# Patient Record
Sex: Female | Born: 1950
Health system: Southern US, Community
[De-identification: ages and names within clinical notes are randomized; demographics above are authoritative.]

## PROBLEM LIST (undated history)

## (undated) DIAGNOSIS — K219 Gastro-esophageal reflux disease without esophagitis: Secondary | ICD-10-CM

## (undated) DIAGNOSIS — M542 Cervicalgia: Secondary | ICD-10-CM

## (undated) DIAGNOSIS — R011 Cardiac murmur, unspecified: Secondary | ICD-10-CM

## (undated) DIAGNOSIS — N898 Other specified noninflammatory disorders of vagina: Secondary | ICD-10-CM

## (undated) DIAGNOSIS — I350 Nonrheumatic aortic (valve) stenosis: Secondary | ICD-10-CM

## (undated) DIAGNOSIS — I10 Essential (primary) hypertension: Secondary | ICD-10-CM

## (undated) DIAGNOSIS — E119 Type 2 diabetes mellitus without complications: Secondary | ICD-10-CM

## (undated) HISTORY — PX: CHOLECYSTECTOMY: SHX55

## (undated) HISTORY — PX: ABDOMINAL HYSTERECTOMY: SHX81

## (undated) HISTORY — DX: Other specified noninflammatory disorders of vagina: N89.8

## (undated) HISTORY — DX: Nonrheumatic aortic (valve) stenosis: I35.0

## (undated) HISTORY — DX: Gastro-esophageal reflux disease without esophagitis: K21.9

## (undated) HISTORY — DX: Essential (primary) hypertension: I10

---

## 2000-10-13 ENCOUNTER — Ambulatory Visit (HOSPITAL_COMMUNITY): Admission: RE | Admit: 2000-10-13 | Discharge: 2000-10-13 | Payer: Self-pay | Admitting: Internal Medicine

## 2000-10-13 ENCOUNTER — Encounter: Payer: Self-pay | Admitting: Internal Medicine

## 2000-12-06 ENCOUNTER — Other Ambulatory Visit: Admission: RE | Admit: 2000-12-06 | Discharge: 2000-12-06 | Payer: Self-pay | Admitting: Obstetrics and Gynecology

## 2004-02-07 ENCOUNTER — Emergency Department (HOSPITAL_COMMUNITY): Admission: EM | Admit: 2004-02-07 | Discharge: 2004-02-07 | Payer: Self-pay | Admitting: Emergency Medicine

## 2009-09-09 ENCOUNTER — Ambulatory Visit (HOSPITAL_COMMUNITY): Admission: RE | Admit: 2009-09-09 | Discharge: 2009-09-09 | Payer: Self-pay | Admitting: Family Medicine

## 2012-12-14 ENCOUNTER — Encounter: Payer: Self-pay | Admitting: Adult Health

## 2012-12-14 ENCOUNTER — Ambulatory Visit (INDEPENDENT_AMBULATORY_CARE_PROVIDER_SITE_OTHER): Payer: BC Managed Care – PPO | Admitting: Adult Health

## 2012-12-14 VITALS — BP 100/60 | Ht 61.0 in | Wt 159.0 lb

## 2012-12-14 DIAGNOSIS — N9489 Other specified conditions associated with female genital organs and menstrual cycle: Secondary | ICD-10-CM

## 2012-12-14 DIAGNOSIS — N898 Other specified noninflammatory disorders of vagina: Secondary | ICD-10-CM | POA: Insufficient documentation

## 2012-12-14 DIAGNOSIS — IMO0002 Reserved for concepts with insufficient information to code with codable children: Secondary | ICD-10-CM

## 2012-12-14 HISTORY — DX: Other specified noninflammatory disorders of vagina: N89.8

## 2012-12-14 MED ORDER — ESTROGENS, CONJUGATED 0.625 MG/GM VA CREA: TOPICAL_CREAM | VAGINAL | Status: DC

## 2012-12-14 NOTE — Patient Instructions (Addendum)
Try premarin vag cream 0.5 gm 2 x week and luvena 3 x week Use astroglide with sex  Increase for play and put hips on pillows Use creams at least 2 weeks before trying to have sex Follow up in 6 weeks

## 2012-12-14 NOTE — Progress Notes (Signed)
Subjective:     Patient ID: Lindsey Evans, female   DOB: 10/22/1950, 62 y.o.   MRN: [id number]  HPI Lindsey Evans is a 62 year old black female, married in complaining of vaginal dryness and pain with sex, and decrease in libido.   Review of Systems Positives in HPI Reviewed past medical,surgical, social and family history. Reviewed medications and allergies.     Objective:   Physical Exam BP 100/60  Ht 5\' 1"  (1.549 m)  Wt 159 lb (72.122 kg)  BMI 30.06 kg/m2   Skin warm and dry.Pelvic: external genitalia is normal in appearance for age, and she has vitiligo, vagina: has decrease cololr, moisture and rugae, cervix and uterus are absent, adnexa: no masses or tenderness noted.  Assessment:    Vaginal dryness and atrophy   Dyspareunia   Plan:      Try Luvena 3 x week Try Premarin vaginal cream 0.5 gm 2 x week, Medication Samples have been provided to the patient. Drug name: Premarin vaginal cream  Qty:  4 tubes  LOT: [id number]  Exp.Date: 2/15 The patient has been instructed regarding the correct time, dose, and frequency of taking this medication, including desired effects and most common side effects.  Astroglide with sex, increase foreplay and elevate hips on pillows Return in 6 weeks for follow up  GRIFFIN,JENNIFER 2:24 PM 12/14/2012

## 2013-01-25 ENCOUNTER — Ambulatory Visit (INDEPENDENT_AMBULATORY_CARE_PROVIDER_SITE_OTHER): Payer: BC Managed Care – PPO | Admitting: Adult Health

## 2013-01-25 ENCOUNTER — Encounter: Payer: Self-pay | Admitting: Adult Health

## 2013-01-25 VITALS — BP 128/80 | Ht 61.0 in | Wt 161.0 lb

## 2013-01-25 DIAGNOSIS — IMO0002 Reserved for concepts with insufficient information to code with codable children: Secondary | ICD-10-CM

## 2013-01-25 DIAGNOSIS — N898 Other specified noninflammatory disorders of vagina: Secondary | ICD-10-CM

## 2013-01-25 DIAGNOSIS — N9489 Other specified conditions associated with female genital organs and menstrual cycle: Secondary | ICD-10-CM

## 2013-01-25 MED ORDER — ESTROGENS, CONJUGATED 0.625 MG/GM VA CREA: TOPICAL_CREAM | VAGINAL | Status: DC

## 2013-01-25 NOTE — Patient Instructions (Addendum)
continue premarin vag cream and luvena could do PVC 3 x weekly if desired Follow up prn Review handout osphenia

## 2013-01-25 NOTE — Progress Notes (Signed)
Subjective:     Patient ID: Lindsey Evans, female   DOB: Dec 04, 1950, 62 y.o.   MRN: [id number]  HPI Lindsey Evans is back after using premarin vaginal cream and luvena and she is not as dry, sex still hurts some, and she is having trouble reaching orgasm.  Review of Systems See HPI Reviewed past medical,surgical, social and family history. Reviewed medications and allergies.     Objective:   Physical Exam BP 128/80  Ht 5\' 1"  (1.549 m)  Wt 161 lb (73.029 kg)  BMI 30.44 kg/m2 Skin warm and dry.Pelvic: external genitalia is normal in appearance, vagina:has more moisture and elasticity and better color,the cervix and uterus are absent. adnexa: no masses or tenderness noted.   Discussed clitoral stimulation and using lubricate. Can try PVC 3 x weekly to see if helps.  Assessment:     Dyspareunia Vaginal dryness resolving    Plan:     Refilled premarin vaginal cream Continue luvena Review handout on osphenia Follow up prn

## 2014-01-03 ENCOUNTER — Ambulatory Visit (INDEPENDENT_AMBULATORY_CARE_PROVIDER_SITE_OTHER): Payer: BC Managed Care – PPO | Admitting: Advanced Practice Midwife

## 2014-01-03 ENCOUNTER — Encounter: Payer: Self-pay | Admitting: Advanced Practice Midwife

## 2014-01-03 VITALS — BP 120/84 | Ht 63.0 in | Wt 166.0 lb

## 2014-01-03 DIAGNOSIS — Z01419 Encounter for gynecological examination (general) (routine) without abnormal findings: Secondary | ICD-10-CM

## 2014-01-03 DIAGNOSIS — I10 Essential (primary) hypertension: Secondary | ICD-10-CM | POA: Insufficient documentation

## 2014-01-03 DIAGNOSIS — IMO0002 Reserved for concepts with insufficient information to code with codable children: Secondary | ICD-10-CM | POA: Insufficient documentation

## 2014-01-03 DIAGNOSIS — E785 Hyperlipidemia, unspecified: Secondary | ICD-10-CM | POA: Insufficient documentation

## 2014-01-03 MED ORDER — OSPEMIFENE 60 MG PO TABS: 60.0000 mg | ORAL_TABLET | Freq: Every day | ORAL | Status: DC

## 2014-01-03 NOTE — Progress Notes (Signed)
Lindsey Evans 63 y.o.  Filed Vitals:   01/03/14 1018  BP: 120/84     Past Medical History: Past Medical History  Diagnosis Date  . Hypertension   . GERD (gastroesophageal reflux disease)   . Vaginal dryness 12/14/2012    Past Surgical History: Past Surgical History  Procedure Laterality Date  . Abdominal hysterectomy    . Cholecystectomy      Family History: Family History  Problem Relation Age of Onset  . Hypertension Mother   . Cancer Father     prostate  . Cancer Paternal Aunt     breast  . Diabetes Paternal Uncle     Social History: History  Substance Use Topics  . Smoking status: Never Smoker   . Smokeless tobacco: Never Used  . Alcohol Use: No    Allergies: No Known Allergies   Current outpatient prescriptions:ALPRAZolam (XANAX) 0.5 MG tablet, Take 0.5 mg by mouth as needed. , Disp: , Rfl: ;  amLODipine (NORVASC) 5 MG tablet, Take 5 mg by mouth daily., Disp: , Rfl: ;  clotrimazole-betamethasone (LOTRISONE) cream, Apply 1 application topically 2 (two) times daily. , Disp: , Rfl: ;  esomeprazole (NEXIUM) 40 MG capsule, Take 40 mg by mouth daily before breakfast., Disp: , Rfl:  hydrochlorothiazide (HYDRODIURIL) 25 MG tablet, Take 25 mg by mouth daily., Disp: , Rfl: ;  metoprolol (LOPRESSOR) 50 MG tablet, Take 50 mg by mouth 2 (two) times daily., Disp: , Rfl: ;  conjugated estrogens (PREMARIN) vaginal cream, Use 0.5 gm 2 x week, Disp: 30 g, Rfl: 6;  Ospemifene 60 MG TABS, Take 60 mg by mouth daily., Disp: 30 tablet, Rfl: 11  History of Present Illness:  Here for well woman exam.  Sees PCP MD regularly who watches labs.  She has a several year hx of vaginal dryness and dyspareunia.  She was on Premarin vaginal cream/Luvena last year.  It definitely helped, but she quit using it as directed d/t "potential SE".  Desires an oral regimen. Discussed osphena and she wants to try this.  She states her last mammogram was "a few years ago" at a mobile site brought to her  work.  She states that she will probably never get yearly mammograms or a colonoscopy because "I think that messing with something that's not broke can cause problems.".  Discussed definite advantages of these screening tests and strongly advised pt to get them. She "probably will" get a mammogram but "definitely won't" get a colonoscopy.    Review of Systems   Patient denies any headaches, blurred vision, shortness of breath, chest pain, abdominal pain, problems with bowel movements, urination  Physical Exam: General:  Well developed, well nourished, no acute distress Skin:  Warm and dry Neck:  Midline trachea, normal thyroid Lungs; Clear to auscultation bilaterally Breast:  No dominant palpable mass, retraction, or nipple discharge Cardiovascular: Regular rate and rhythm Abdomen:  Soft, non tender, no hepatosplenomegaly Pelvic:  External genitalia is normal in appearance. Vagina is pale pink with fair rugae.  No lesions. Uterus and ovaries surgically absent Rectal: Good sphincter tone, no polyps, or hemorrhoids felt. Guiac negative  Extremities:  No swelling or varicosities noted Psych:  No mood changes   Impression:  Well woman exam Dyspareunia 2/2 vaginal dryness   Plan: Osphena 60mg  qd and Luvena q 3-4 days

## 2014-01-09 ENCOUNTER — Other Ambulatory Visit: Payer: BC Managed Care – PPO | Admitting: Advanced Practice Midwife

## 2014-01-15 ENCOUNTER — Ambulatory Visit (HOSPITAL_COMMUNITY)
Admission: RE | Admit: 2014-01-15 | Discharge: 2014-01-15 | Disposition: A | Discharge status: Home / Self Care | Payer: BC Managed Care – PPO | Source: Ambulatory Visit | Attending: Family Medicine | Admitting: Family Medicine

## 2014-01-15 ENCOUNTER — Other Ambulatory Visit (HOSPITAL_COMMUNITY): Payer: Self-pay | Admitting: Family Medicine

## 2014-01-15 ENCOUNTER — Encounter (HOSPITAL_COMMUNITY): Payer: Self-pay

## 2014-01-15 DIAGNOSIS — M542 Cervicalgia: Secondary | ICD-10-CM | POA: Insufficient documentation

## 2014-01-15 DIAGNOSIS — R51 Headache: Secondary | ICD-10-CM | POA: Insufficient documentation

## 2014-01-16 ENCOUNTER — Other Ambulatory Visit (HOSPITAL_COMMUNITY): Payer: Self-pay | Admitting: Family Medicine

## 2014-01-16 DIAGNOSIS — R51 Headache: Secondary | ICD-10-CM

## 2014-01-17 ENCOUNTER — Emergency Department (HOSPITAL_COMMUNITY)
Admission: EM | Admit: 2014-01-17 | Discharge: 2014-01-17 | Disposition: A | Discharge status: Home / Self Care | Payer: BC Managed Care – PPO | Attending: Emergency Medicine | Admitting: Emergency Medicine

## 2014-01-17 ENCOUNTER — Emergency Department (HOSPITAL_COMMUNITY): Payer: BC Managed Care – PPO

## 2014-01-17 ENCOUNTER — Encounter (HOSPITAL_COMMUNITY): Payer: Self-pay | Admitting: Emergency Medicine

## 2014-01-17 DIAGNOSIS — R519 Headache, unspecified: Secondary | ICD-10-CM

## 2014-01-17 DIAGNOSIS — Z8742 Personal history of other diseases of the female genital tract: Secondary | ICD-10-CM | POA: Insufficient documentation

## 2014-01-17 DIAGNOSIS — Z79899 Other long term (current) drug therapy: Secondary | ICD-10-CM | POA: Insufficient documentation

## 2014-01-17 DIAGNOSIS — K219 Gastro-esophageal reflux disease without esophagitis: Secondary | ICD-10-CM | POA: Insufficient documentation

## 2014-01-17 DIAGNOSIS — I10 Essential (primary) hypertension: Secondary | ICD-10-CM | POA: Insufficient documentation

## 2014-01-17 DIAGNOSIS — M62838 Other muscle spasm: Secondary | ICD-10-CM | POA: Insufficient documentation

## 2014-01-17 DIAGNOSIS — R51 Headache: Secondary | ICD-10-CM | POA: Insufficient documentation

## 2014-01-17 MED ORDER — DIPHENHYDRAMINE HCL 25 MG PO CAPS
25.0000 mg | ORAL_CAPSULE | Freq: Once | ORAL | Status: AC
  Administered 2014-01-17: 25 mg via ORAL
  Filled 2014-01-17: qty 1

## 2014-01-17 MED ORDER — PROMETHAZINE HCL 25 MG/ML IJ SOLN: 12.5000 mg | Freq: Once | INTRAMUSCULAR | Status: DC

## 2014-01-17 MED ORDER — KETOROLAC TROMETHAMINE 30 MG/ML IJ SOLN
30.0000 mg | Freq: Once | INTRAMUSCULAR | Status: AC
  Administered 2014-01-17: 30 mg via INTRAMUSCULAR
  Filled 2014-01-17: qty 1

## 2014-01-17 MED ORDER — IBUPROFEN 800 MG PO TABS: 800.0000 mg | ORAL_TABLET | Freq: Three times a day (TID) | ORAL | Status: DC

## 2014-01-17 MED ORDER — ACETAMINOPHEN 325 MG PO TABS
650.0000 mg | ORAL_TABLET | Freq: Once | ORAL | Status: AC
  Administered 2014-01-17: 650 mg via ORAL
  Filled 2014-01-17: qty 2

## 2014-01-17 MED ORDER — METHOCARBAMOL 500 MG PO TABS: 500.0000 mg | ORAL_TABLET | Freq: Two times a day (BID) | ORAL | Status: DC | PRN

## 2014-01-17 NOTE — Discharge Instructions (Signed)
°Emergency Department Resource Guide °1) Find a Doctor and Pay Out of Pocket °Although you won't have to find out who is covered by your insurance plan, it is a good idea to ask around and get recommendations. You will then need to call the office and see if the doctor you have chosen will accept you as a new patient and what types of options they offer for patients who are self-pay. Some doctors offer discounts or will set up payment plans for their patients who do not have insurance, but you will need to ask so you aren't surprised when you get to your appointment. ° °2) Contact Your Local Health Department °Not all health departments have doctors that can see patients for sick visits, but many do, so it is worth a call to see if yours does. If you don't know where your local health department is, you can check in your phone book. The CDC also has a tool to help you locate your state's health department, and many state websites also have listings of all of their local health departments. ° °3) Find a Walk-in Clinic °If your illness is not likely to be very severe or complicated, you may want to try a walk in clinic. These are popping up all over the country in pharmacies, drugstores, and shopping centers. They're usually staffed by nurse practitioners or physician assistants that have been trained to treat common illnesses and complaints. They're usually fairly quick and inexpensive. However, if you have serious medical issues or chronic medical problems, these are probably not your best option. ° °No Primary Care Doctor: °- Call Health Connect at  832-8000 - they can help you locate a primary care doctor that  accepts your insurance, provides certain services, etc. °- Physician Referral Service- 1-800-533-3463 ° °Chronic Pain Problems: °Organization         Address  Phone   Notes  °[facility]  (336) 297-2271 Patients need to be referred by their primary care doctor.  ° °Medication  Assistance: °Organization         Address  Phone   Notes  °[place] [facility] [address] °[place], [place] [address] (336) 641-8030 --Must be a resident of [place] °-- Must have NO insurance coverage whatsoever (no Medicaid/ Medicare, etc.) °-- The pt. MUST have a primary care doctor that directs their care regularly and follows them in the community °  °[facility]  (866) 331-1348   °[employer]  (888) 892-1162   ° °Agencies that provide inexpensive medical care: °Organization         Address  Phone   Notes  °[facility]  (336) 832-8035   °[facility]    (336) 832-7272   °[facility] [address] °[place], [place] [address] (336) 832-4777   °[facility] [address], °[place] (336) 271-4999   °[facility]    (336) 373-0678   °[facility]    (336) 272-1050   °[facility] ° [address], [place] Phone:  (336) 832-4444, Fax:  (336) 832-4440 Hours of Operation:  9 am - 6 pm, M-F.  Also accepts Medicaid/Medicare and self-pay.  °[facility] ° [address],  Phone: (336) 832-3150, Fax: (336) [phone]. Hours of Operation:  8:30 am - 5:30 pm, M-F.  Also accepts Medicaid and self-pay.  °[facility] [place] [address], [place] Phone: (336) 878-6027   °[facility] [address], [place], [place] (336)723-1848, Ext. 123 Mondays & Thursdays: 7-9 AM.  First 15 patients are seen on a first come, first serve basis. °  ° °Medicaid-accepting [place] Providers: ° °Organization         Address  Phone   Notes  °[facility] [address], [place] (336) 641-2100 Also accepts self-pay patients.  °[facility] [address], [place] ° (336) 856-9996   °[facility] [address], [place]  (336) 288-8857   °[facility] [address] [place] [address], [place] (336) 299-7000   °Veita Bland [address], [place]  ° (336) 373-1557 Only accepts [place] Access Medicaid patients after they have their name applied to their card.  ° °Self-Pay (no insurance) in [place]: ° °Organization         Address  Phone   Notes  °Sickle Cell Patients, [facility] [address], [place] (336) 832-1970   °[facility] [address], [place] (336) 832-4400   °[facility] [place] ° [address], [place] (336) 992-4800   °[facility]/Dr. Osei-Bonsu ° [address] [place] [address], [place] or [address], [place] (336) 841-8500 Phone number for both [place] and [place] locations is the same.  °[facility] [address], [place] (336) 299-0000   °[facility] [address] [place] [address], [place] or [address] (336) 852-7530 °(336) 878-2260   °[facility] [address], [place] (336) 350-1642, phone; (336) 294-5005, fax Sees patients 1st and 3rd Saturday of every month.  Must not qualify for public or private insurance (i.e. Medicaid, Medicare, [place] Health Choice, Veterans' Benefits) • Household income should be no more than 200% of the poverty level •The clinic cannot treat you if you are pregnant or think you are pregnant • Sexually transmitted diseases are not treated at the clinic.  ° ° °Dental Care: °Organization         Address  Phone  Notes  °[place] [facility] [address], [place] (336) 641-6152 Accepts children up to age 21 who are enrolled in Medicaid or [place] Health Choice; pregnant women with a Medicaid card; and children who have applied for Medicaid or [place] Health Choice, but were declined, whose parents can pay a reduced fee at time of service.  °[place]  [facility] [place]  [address], [place] (336) 641-7733 Accepts children up to age 21 who are enrolled in Medicaid or [place] Health Choice; pregnant women with a Medicaid card; and children who have applied for Medicaid or [place] Health Choice, but were declined, whose parents can pay a reduced fee at time of service.  °[facility] ° [address], [place] (336) 641-4533 Patients are seen by appointment only. Walk-ins are not accepted. [facility] will see patients 18 years of age and older. °Monday - Tuesday (8am-5pm) °Most Wednesdays (8:30-5pm) °$30 per visit, cash only  °[facility] ° [address], [place] (336) 641-4533 Patients are seen by appointment only. Walk-ins are not accepted. [facility] will see patients 18 years of age and older. °One   Wednesday Evening (Monthly: Volunteer Based).  $30 per visit, cash only  °[facility]  (919) 537-3737 for adults; Children under age 4, call Graduate Pediatric Dentistry at ([phone]) [phone]. Children aged 4-14, please call ([phone]) [phone] to request a pediatric application. ° Dental services are provided in all areas of dental care including fillings, crowns and bridges, complete and partial dentures, implants, gum treatment, root canals, and extractions. Preventive care is also provided. Treatment is provided to both adults and children. °Patients are selected via a lottery and there is often a waiting list. °  °[facility] [address], °[place] ° (336) 763-8833 [email or web address] °  °[facility] [address], [place], [place] (336)723-1848, Ext. 123 Second and Fourth Thursday of each month, opens at 6:30 AM; Clinic ends at 9 AM.  Patients are seen on a first-come first-served basis, and a limited number are seen during each clinic.  ° °[facility] ° [address], [place], [place] (336) 723-7904    Eligibility Requirements °You must have lived in [place], [place], or [place] counties for at least the last three months. °  You cannot be eligible for state or federal sponsored healthcare insurance, including Veterans Administration, Medicaid, or Medicare. °  You generally cannot be eligible for healthcare insurance through your employer.  °  How to apply: °Eligibility screenings are held every Tuesday and Wednesday afternoon from 1:00 pm until 4:00 pm. You do not need an appointment for the interview!  °[facility] [address], [place], [place] 336-631-2330   °[facility]  336-342-8273   °[facility]  336-703-3100   °[facility]  336-570-6415   ° °Behavioral Health Resources in the Community: °Intensive Outpatient Programs °Organization         Address  Phone  Notes  °[facility] [address], [place], [place] 336-878-6098   °[facility] [address], [place], [place] 336-832-9800   °[facility] [address], [place], [place] ° 336-882-2125   °[facility] [address],  °[place], [place] 1-800-853-5163 or 336-641-4981   °Substance Abuse Resources °Organization         Address  Phone  Notes  °[facility]  336-882-2125   °[facility]  336-784-9470   °[facility]  336-285-9073   °[facility]  336-845-3988   °Residential & Outpatient Substance Abuse Program  1-800-659-3381   °Psychological Services °Organization         Address  Phone  Notes  °[facility]  336- 832-9600   °[facility]  336- 378-7881   °[facility] [address], [place] 1-800-853-5163 or 336-641-4981   ° °Mobile Crisis Teams °Organization         Address  Phone  Notes  °[facility], Mobile Crisis Care Unit  1-877-626-1772   °Assertive °[facility] ° [address]  [place], [place] 336-834-9664   °Sharon DeEsch [address] °[place] 336-554-5454   ° °Self-Help/Support Groups °Organization         Address  Phone             Notes  °[facility]  - variety of support groups  336- 373-1402 Call for more information  °Narcotics Anonymous (NA), [facility] [address], °[place]   2 meetings at this location  ° °  Residential Treatment Programs Organization         Address  Phone  Notes  [facility] [address]    [place]  1-817-762-8220   [facility]  [address], [place] [address], [place], [place]   [place], [place] 971-252-7245 Admissions: 8am-3pm M-F  [facility] [place] [address]    [place], [place] 670-141-0301   [facility] [address] [place], [place], [place]   [facility] [address]  [place], [place]   [facility] Intensive Outpatient [address], [place], [place]   [facility] [address]  [place], [place] 1-(601)261-5766 or 252-091-2225   [facility] [address], [place], [place] Accepts Medicaid  [facility] [address]  [place] 1-2094428644 Substance Abuse/Addiction Treatment   [facility]         Address  Phone  Notes  [facility]  716-855-5741   Domenic Schwab, PhD [address] [place], [place]   (718) 309-8361 or (575) 037-8122   [place], [place] 670-394-0560   [facility] [address], [place], [place] [phone] Insurance/Medicaid/sponsorship through [facility] and Families [address]                                    [place], [place] 863-821-8807 [facility] [address], [place] 901-078-5956    Dr. Adele Schilder  [phone]   [facility] Dept. 1) [address] [place] 2) [place] 3)  [address], [place] 904-471-2733 (458)[phone]  [phone]   [place] (340) 535-2341 or 201-076-5008 (After Hours)       Stop taking the naprosyn and flexeril. Take the new prescriptions as directed.  Apply moist heat or ice to the area(s) of discomfort, for 15 minutes at a time, several times per day for the next few days.  Do not fall asleep on a heating or ice pack.  Call your regular medical doctor today to schedule a follow up appointment in the next 2 days.  Return to the Emergency Department immediately if worsening.
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
## 2014-01-17 NOTE — ED Notes (Signed)
Had a headache since Saturday.  Had a CT scan on Tuesday. Here at [facility].  Scheduled to have MRI Friday.  Rates pain 10.  Took Muscle relaxer.  Had shot on Tues and pain was some better.

## 2014-01-18 ENCOUNTER — Ambulatory Visit (HOSPITAL_COMMUNITY): Payer: BC Managed Care – PPO

## 2014-04-22 ENCOUNTER — Encounter (HOSPITAL_COMMUNITY): Payer: Self-pay | Admitting: Emergency Medicine

## 2014-08-29 DIAGNOSIS — L700 Acne vulgaris: Secondary | ICD-10-CM | POA: Diagnosis not present

## 2014-08-29 DIAGNOSIS — L818 Other specified disorders of pigmentation: Secondary | ICD-10-CM | POA: Diagnosis not present

## 2014-08-29 DIAGNOSIS — L659 Nonscarring hair loss, unspecified: Secondary | ICD-10-CM | POA: Diagnosis not present

## 2014-09-02 DIAGNOSIS — M9901 Segmental and somatic dysfunction of cervical region: Secondary | ICD-10-CM | POA: Diagnosis not present

## 2014-09-02 DIAGNOSIS — M546 Pain in thoracic spine: Secondary | ICD-10-CM | POA: Diagnosis not present

## 2014-09-02 DIAGNOSIS — M9902 Segmental and somatic dysfunction of thoracic region: Secondary | ICD-10-CM | POA: Diagnosis not present

## 2014-09-02 DIAGNOSIS — M542 Cervicalgia: Secondary | ICD-10-CM | POA: Diagnosis not present

## 2014-09-11 DIAGNOSIS — J0100 Acute maxillary sinusitis, unspecified: Secondary | ICD-10-CM | POA: Diagnosis not present

## 2014-09-11 DIAGNOSIS — E663 Overweight: Secondary | ICD-10-CM | POA: Diagnosis not present

## 2014-09-11 DIAGNOSIS — I10 Essential (primary) hypertension: Secondary | ICD-10-CM | POA: Diagnosis not present

## 2014-09-11 DIAGNOSIS — H6123 Impacted cerumen, bilateral: Secondary | ICD-10-CM | POA: Diagnosis not present

## 2014-09-11 DIAGNOSIS — E538 Deficiency of other specified B group vitamins: Secondary | ICD-10-CM | POA: Diagnosis not present

## 2014-09-11 DIAGNOSIS — Z6829 Body mass index (BMI) 29.0-29.9, adult: Secondary | ICD-10-CM | POA: Diagnosis not present

## 2014-09-11 DIAGNOSIS — F411 Generalized anxiety disorder: Secondary | ICD-10-CM | POA: Diagnosis not present

## 2014-11-25 DIAGNOSIS — M9902 Segmental and somatic dysfunction of thoracic region: Secondary | ICD-10-CM | POA: Diagnosis not present

## 2014-11-25 DIAGNOSIS — M546 Pain in thoracic spine: Secondary | ICD-10-CM | POA: Diagnosis not present

## 2014-11-25 DIAGNOSIS — M9901 Segmental and somatic dysfunction of cervical region: Secondary | ICD-10-CM | POA: Diagnosis not present

## 2014-11-25 DIAGNOSIS — M542 Cervicalgia: Secondary | ICD-10-CM | POA: Diagnosis not present

## 2014-12-02 DIAGNOSIS — E538 Deficiency of other specified B group vitamins: Secondary | ICD-10-CM | POA: Diagnosis not present

## 2015-02-07 DIAGNOSIS — D510 Vitamin B12 deficiency anemia due to intrinsic factor deficiency: Secondary | ICD-10-CM | POA: Diagnosis not present

## 2015-02-17 DIAGNOSIS — M9902 Segmental and somatic dysfunction of thoracic region: Secondary | ICD-10-CM | POA: Diagnosis not present

## 2015-02-17 DIAGNOSIS — M542 Cervicalgia: Secondary | ICD-10-CM | POA: Diagnosis not present

## 2015-02-17 DIAGNOSIS — M546 Pain in thoracic spine: Secondary | ICD-10-CM | POA: Diagnosis not present

## 2015-02-17 DIAGNOSIS — M9901 Segmental and somatic dysfunction of cervical region: Secondary | ICD-10-CM | POA: Diagnosis not present

## 2015-03-01 DIAGNOSIS — Z23 Encounter for immunization: Secondary | ICD-10-CM | POA: Diagnosis not present

## 2015-03-26 DIAGNOSIS — E663 Overweight: Secondary | ICD-10-CM | POA: Diagnosis not present

## 2015-03-26 DIAGNOSIS — K219 Gastro-esophageal reflux disease without esophagitis: Secondary | ICD-10-CM | POA: Diagnosis not present

## 2015-03-26 DIAGNOSIS — I10 Essential (primary) hypertension: Secondary | ICD-10-CM | POA: Diagnosis not present

## 2015-03-26 DIAGNOSIS — F419 Anxiety disorder, unspecified: Secondary | ICD-10-CM | POA: Diagnosis not present

## 2015-03-26 DIAGNOSIS — Z1389 Encounter for screening for other disorder: Secondary | ICD-10-CM | POA: Diagnosis not present

## 2015-03-26 DIAGNOSIS — E538 Deficiency of other specified B group vitamins: Secondary | ICD-10-CM | POA: Diagnosis not present

## 2015-03-26 DIAGNOSIS — Z6828 Body mass index (BMI) 28.0-28.9, adult: Secondary | ICD-10-CM | POA: Diagnosis not present

## 2015-03-27 DIAGNOSIS — E039 Hypothyroidism, unspecified: Secondary | ICD-10-CM | POA: Diagnosis not present

## 2015-03-27 DIAGNOSIS — R7301 Impaired fasting glucose: Secondary | ICD-10-CM | POA: Diagnosis not present

## 2015-03-27 DIAGNOSIS — R079 Chest pain, unspecified: Secondary | ICD-10-CM | POA: Diagnosis not present

## 2015-03-27 DIAGNOSIS — I10 Essential (primary) hypertension: Secondary | ICD-10-CM | POA: Diagnosis not present

## 2015-03-27 DIAGNOSIS — Z1389 Encounter for screening for other disorder: Secondary | ICD-10-CM | POA: Diagnosis not present

## 2015-03-27 DIAGNOSIS — E781 Pure hyperglyceridemia: Secondary | ICD-10-CM | POA: Diagnosis not present

## 2015-04-30 DIAGNOSIS — M79672 Pain in left foot: Secondary | ICD-10-CM | POA: Diagnosis not present

## 2015-04-30 DIAGNOSIS — M79671 Pain in right foot: Secondary | ICD-10-CM | POA: Diagnosis not present

## 2015-04-30 DIAGNOSIS — M2011 Hallux valgus (acquired), right foot: Secondary | ICD-10-CM | POA: Diagnosis not present

## 2015-04-30 DIAGNOSIS — M2012 Hallux valgus (acquired), left foot: Secondary | ICD-10-CM | POA: Diagnosis not present

## 2015-05-05 DIAGNOSIS — M9902 Segmental and somatic dysfunction of thoracic region: Secondary | ICD-10-CM | POA: Diagnosis not present

## 2015-05-05 DIAGNOSIS — M546 Pain in thoracic spine: Secondary | ICD-10-CM | POA: Diagnosis not present

## 2015-05-05 DIAGNOSIS — M542 Cervicalgia: Secondary | ICD-10-CM | POA: Diagnosis not present

## 2015-05-05 DIAGNOSIS — M9901 Segmental and somatic dysfunction of cervical region: Secondary | ICD-10-CM | POA: Diagnosis not present

## 2015-05-19 ENCOUNTER — Encounter (INDEPENDENT_AMBULATORY_CARE_PROVIDER_SITE_OTHER): Payer: Self-pay | Admitting: *Deleted

## 2015-05-23 ENCOUNTER — Ambulatory Visit (INDEPENDENT_AMBULATORY_CARE_PROVIDER_SITE_OTHER): Payer: Medicare Other | Admitting: Cardiology

## 2015-05-23 ENCOUNTER — Ambulatory Visit (HOSPITAL_COMMUNITY)
Admission: RE | Admit: 2015-05-23 | Discharge: 2015-05-23 | Disposition: A | Discharge status: Home / Self Care | Payer: Medicare Other | Source: Ambulatory Visit | Attending: Cardiology | Admitting: Cardiology

## 2015-05-23 ENCOUNTER — Encounter: Payer: Self-pay | Admitting: Cardiology

## 2015-05-23 ENCOUNTER — Ambulatory Visit: Payer: Self-pay | Admitting: Cardiology

## 2015-05-23 VITALS — BP 108/80 | HR 68 | Wt 162.0 lb

## 2015-05-23 DIAGNOSIS — E785 Hyperlipidemia, unspecified: Secondary | ICD-10-CM | POA: Diagnosis not present

## 2015-05-23 DIAGNOSIS — I10 Essential (primary) hypertension: Secondary | ICD-10-CM | POA: Diagnosis not present

## 2015-05-23 DIAGNOSIS — R0789 Other chest pain: Secondary | ICD-10-CM | POA: Diagnosis not present

## 2015-05-23 DIAGNOSIS — R011 Cardiac murmur, unspecified: Secondary | ICD-10-CM | POA: Insufficient documentation

## 2015-05-23 NOTE — Patient Instructions (Signed)
Your physician wants you to follow-up in: 6 months with Dr. Bryna Colander will receive a reminder letter in the mail two months in advance. If you don't receive a letter, please call our office to schedule the follow-up appointment.    Your physician recommends that you continue on your current medications as directed. Please refer to the Current Medication list given to you today.   If you need a refill on your cardiac medications before your next appointment, please call your pharmacy.      Your physician has requested that you have an echocardiogram. Echocardiography is a painless test that uses sound waves to create images of your heart. It provides your doctor with information about the size and shape of your heart and how well your heart's chambers and valves are working. This procedure takes approximately one hour. There are no restrictions for this procedure.        Thank you for choosing [facility] !

## 2015-05-23 NOTE — Progress Notes (Signed)
Patient ID: Lindsey Evans, female   DOB: 1951-04-02, 64 y.o.   MRN: [id number]     Clinical Summary Lindsey Evans is a 64 y.o.female seen today as a new patient for the following medical problems.  1. Chest pain - started about 3 months ago. Midchest aching pain 2-3/10 in severity. Often occurs with laying down. Can have some mild nausea with it. Can have some dysphagia. Not positional. No relation to food. Pain can last 24 hrs nonstop. Improved with increased PPI recently and sucralfate.  - walks daily 1 mile, tolerates well without any symptoms.  - no DOE, can have some LE edema at times.   CAD risk factors: HTN, HL, DM2   Past Medical History  Diagnosis Date  . Hypertension   . GERD (gastroesophageal reflux disease)   . Vaginal dryness 12/14/2012     No Known Allergies  FH No family history of heart disease  Current Outpatient Prescriptions  Medication Sig Dispense Refill  . acetaminophen (TYLENOL) 500 MG tablet Take 500 mg by mouth daily as needed for headache.    . ALPRAZolam (XANAX) 0.5 MG tablet Take 0.5 mg by mouth as needed for anxiety.     Marland Kitchen amLODipine (NORVASC) 5 MG tablet Take 5 mg by mouth daily.    Marland Kitchen conjugated estrogens (PREMARIN) vaginal cream Use 0.5 gm 2 x week 30 g 6  . esomeprazole (NEXIUM) 40 MG capsule Take 40 mg by mouth daily before breakfast.    . hydrochlorothiazide (HYDRODIURIL) 25 MG tablet Take 25 mg by mouth daily.    Marland Kitchen ibuprofen (ADVIL,MOTRIN) 800 MG tablet Take 1 tablet (800 mg total) by mouth 3 (three) times daily. Take with food. 15 tablet 0  . methocarbamol (ROBAXIN) 500 MG tablet Take 1 tablet (500 mg total) by mouth 2 (two) times daily as needed for muscle spasms. 10 tablet 0  . metoprolol (LOPRESSOR) 50 MG tablet Take 50 mg by mouth daily.     . Ospemifene 60 MG TABS Take 60 mg by mouth daily. 30 tablet 11   No current facility-administered medications for this visit.     Past Surgical History  Procedure Laterality Date  . Abdominal  hysterectomy    . Cholecystectomy       No Known Allergies    Family History  Problem Relation Age of Onset  . Hypertension Mother   . Cancer Father     prostate  . Cancer Paternal Aunt     breast  . Diabetes Paternal Uncle      Social History Lindsey Evans reports that she has never smoked. She has never used smokeless tobacco. Lindsey Evans reports that she does not drink alcohol.   Review of Systems CONSTITUTIONAL: No weight loss, fever, chills, weakness or fatigue.  HEENT: Eyes: No visual loss, blurred vision, double vision or yellow sclerae.No hearing loss, sneezing, congestion, runny nose or sore throat.  SKIN: No rash or itching.  CARDIOVASCULAR: per hpi RESPIRATORY: No shortness of breath, cough or sputum.  GASTROINTESTINAL: No anorexia, nausea, vomiting or diarrhea. No abdominal pain or blood.  GENITOURINARY: No burning on urination, no polyuria NEUROLOGICAL: No headache, dizziness, syncope, paralysis, ataxia, numbness or tingling in the extremities. No change in bowel or bladder control.  MUSCULOSKELETAL: No muscle, back pain, joint pain or stiffness.  LYMPHATICS: No enlarged nodes. No history of splenectomy.  PSYCHIATRIC: No history of depression or anxiety.  ENDOCRINOLOGIC: No reports of sweating, cold or heat intolerance. No polyuria or polydipsia.  Marland Kitchen  Physical Examination Filed Vitals:   05/23/15 0915  BP: 108/80  Pulse: 68   Filed Vitals:   05/23/15 0915  Weight: 162 lb (73.483 kg)    Gen: resting comfortably, no acute distress HEENT: no scleral icterus, pupils equal round and reactive, no palptable cervical adenopathy,  CV: RRR, 2/6 systolic murmur rusb,  no jvd Resp: Clear to auscultation bilaterally GI: abdomen is soft, non-tender, non-distended, normal bowel sounds, no hepatosplenomegaly MSK: extremities are warm, no edema.  Skin: warm, no rash Neuro:  no focal deficits Psych: appropriate affect   Diagnostic Studies     Assessment and  Plan  1. Chest pain - consistent with GI etiology, improved with increased PPI and starting sucralfate. No indication for cardiac ischemic testing at this time  2. Heart murmur - obtain echo  F/u 6 months      Arnoldo Lenis, M.D.

## 2015-06-12 ENCOUNTER — Encounter (INDEPENDENT_AMBULATORY_CARE_PROVIDER_SITE_OTHER): Payer: Self-pay | Admitting: *Deleted

## 2015-06-12 ENCOUNTER — Encounter (INDEPENDENT_AMBULATORY_CARE_PROVIDER_SITE_OTHER): Payer: Self-pay | Admitting: Internal Medicine

## 2015-06-12 ENCOUNTER — Other Ambulatory Visit (INDEPENDENT_AMBULATORY_CARE_PROVIDER_SITE_OTHER): Payer: Self-pay | Admitting: Internal Medicine

## 2015-06-12 ENCOUNTER — Ambulatory Visit (INDEPENDENT_AMBULATORY_CARE_PROVIDER_SITE_OTHER): Payer: Medicare Other | Admitting: Internal Medicine

## 2015-06-12 VITALS — BP 102/78 | HR 60 | Temp 97.6°F | Ht 61.0 in | Wt 167.1 lb

## 2015-06-12 DIAGNOSIS — K219 Gastro-esophageal reflux disease without esophagitis: Secondary | ICD-10-CM

## 2015-06-12 DIAGNOSIS — Z1211 Encounter for screening for malignant neoplasm of colon: Secondary | ICD-10-CM

## 2015-06-12 NOTE — Progress Notes (Signed)
   Subjective:    Patient ID: Lindsey Evans, female    DOB: 1951-01-06, 64 y.o.   MRN: [id number]  HPI Referred by Dr Gerarda Fraction for GERD/dysphagia,screening colonoscopy. She presents today with c/o chest pain. She tells me she was having acid reflux. The Nexium was increased to twice a day and symptoms are better.  Nexium increased in August. She tells me she is not having any problems   since taking the Nexium BID. Her appetite is good. No weight loss. Her BMs are normal. Usually has a BM daily. No melena or BRRB.  She has never undergone a colonoscopy in the past.    Review of Systems Past Medical History  Diagnosis Date  . Hypertension   . GERD (gastroesophageal reflux disease)   . Vaginal dryness 12/14/2012    Past Surgical History  Procedure Laterality Date  . Abdominal hysterectomy    . Cholecystectomy      Gallstones    No Known Allergies  Current Outpatient Prescriptions on File Prior to Visit  Medication Sig Dispense Refill  . acetaminophen (TYLENOL) 500 MG tablet Take 500 mg by mouth daily as needed for headache.    . ALPRAZolam (XANAX) 0.5 MG tablet Take 0.5 mg by mouth as needed for anxiety.     Marland Kitchen amLODipine (NORVASC) 5 MG tablet Take 5 mg by mouth daily.    Marland Kitchen atorvastatin (LIPITOR) 20 MG tablet Take 20 mg by mouth daily.    Marland Kitchen esomeprazole (NEXIUM) 40 MG capsule Take 40 mg by mouth 2 (two) times daily before a meal.     . hydrochlorothiazide (HYDRODIURIL) 25 MG tablet Take 25 mg by mouth daily.    Marland Kitchen ibuprofen (ADVIL,MOTRIN) 800 MG tablet Take 1 tablet (800 mg total) by mouth 3 (three) times daily. Take with food. 15 tablet 0  . metFORMIN (GLUCOPHAGE) 500 MG tablet Take by mouth 2 (two) times daily with a meal.    . metoprolol succinate (TOPROL-XL) 50 MG 24 hr tablet Take 50 mg by mouth daily. Take with or immediately following a meal.    . sucralfate (CARAFATE) 1 G tablet Take 1 g by mouth at bedtime.      No current facility-administered medications on file prior to  visit.        Objective:   Physical Exam Blood pressure 102/78, pulse 60, temperature 97.6 F (36.4 C), height 5\' 1"  (1.549 m), weight 167 lb 1.6 oz (75.796 kg).   Alert and oriented. Skin warm and dry. Oral mucosa is moist.   . Sclera anicteric, conjunctivae is pink. Thyroid not enlarged. No cervical lymphadenopathy. Lungs clear. Heart regular rate and rhythm.  Abdomen is soft. Bowel sounds are positive. No hepatomegaly. No abdominal masses felt. No tenderness.  No edema to lower extremities.        Assessment & Plan:  GERD controlled at this time with Nexium and Carafate. I discussed with Dr Laural Golden. EGD Screening colonoscopy. Patient declined colonoscopy.

## 2015-06-12 NOTE — Patient Instructions (Addendum)
EGD. The risks and benefits such as perforation, bleeding, and infection were reviewed with the patient and is agreeable. 

## 2015-07-03 ENCOUNTER — Encounter (HOSPITAL_COMMUNITY)
Admission: RE | Disposition: A | Discharge status: Home / Self Care | Payer: Self-pay | Source: Ambulatory Visit | Attending: Internal Medicine

## 2015-07-03 ENCOUNTER — Ambulatory Visit (HOSPITAL_COMMUNITY)
Admission: RE | Admit: 2015-07-03 | Discharge: 2015-07-03 | Disposition: A | Discharge status: Home / Self Care | Payer: Medicare Other | Source: Ambulatory Visit | Attending: Internal Medicine | Admitting: Internal Medicine

## 2015-07-03 ENCOUNTER — Encounter (HOSPITAL_COMMUNITY): Payer: Self-pay | Admitting: *Deleted

## 2015-07-03 DIAGNOSIS — Z7984 Long term (current) use of oral hypoglycemic drugs: Secondary | ICD-10-CM | POA: Diagnosis not present

## 2015-07-03 DIAGNOSIS — R079 Chest pain, unspecified: Secondary | ICD-10-CM | POA: Diagnosis not present

## 2015-07-03 DIAGNOSIS — Z79899 Other long term (current) drug therapy: Secondary | ICD-10-CM | POA: Diagnosis not present

## 2015-07-03 DIAGNOSIS — Z7982 Long term (current) use of aspirin: Secondary | ICD-10-CM | POA: Insufficient documentation

## 2015-07-03 DIAGNOSIS — Q394 Esophageal web: Secondary | ICD-10-CM | POA: Diagnosis not present

## 2015-07-03 DIAGNOSIS — E119 Type 2 diabetes mellitus without complications: Secondary | ICD-10-CM | POA: Insufficient documentation

## 2015-07-03 DIAGNOSIS — I10 Essential (primary) hypertension: Secondary | ICD-10-CM | POA: Insufficient documentation

## 2015-07-03 DIAGNOSIS — F419 Anxiety disorder, unspecified: Secondary | ICD-10-CM | POA: Insufficient documentation

## 2015-07-03 DIAGNOSIS — K2970 Gastritis, unspecified, without bleeding: Secondary | ICD-10-CM | POA: Diagnosis not present

## 2015-07-03 DIAGNOSIS — R1310 Dysphagia, unspecified: Secondary | ICD-10-CM | POA: Diagnosis not present

## 2015-07-03 DIAGNOSIS — K3189 Other diseases of stomach and duodenum: Secondary | ICD-10-CM | POA: Diagnosis not present

## 2015-07-03 DIAGNOSIS — K219 Gastro-esophageal reflux disease without esophagitis: Secondary | ICD-10-CM | POA: Insufficient documentation

## 2015-07-03 DIAGNOSIS — K2950 Unspecified chronic gastritis without bleeding: Secondary | ICD-10-CM | POA: Diagnosis not present

## 2015-07-03 DIAGNOSIS — Z1211 Encounter for screening for malignant neoplasm of colon: Secondary | ICD-10-CM

## 2015-07-03 HISTORY — DX: Type 2 diabetes mellitus without complications: E11.9

## 2015-07-03 HISTORY — PX: ESOPHAGOGASTRODUODENOSCOPY: SHX5428

## 2015-07-03 LAB — GLUCOSE, CAPILLARY: GLUCOSE-CAPILLARY: 132 mg/dL — AB (ref 65–99)

## 2015-07-03 SURGERY — EGD (ESOPHAGOGASTRODUODENOSCOPY)
Anesthesia: Moderate Sedation

## 2015-07-03 MED ORDER — MIDAZOLAM HCL 5 MG/5ML IJ SOLN
INTRAMUSCULAR | Status: DC | PRN
  Administered 2015-07-03: 2 mg via INTRAVENOUS
  Administered 2015-07-03: 1 mg via INTRAVENOUS
  Administered 2015-07-03: 3 mg via INTRAVENOUS
  Administered 2015-07-03 (×2): 1 mg via INTRAVENOUS
  Administered 2015-07-03: 2 mg via INTRAVENOUS

## 2015-07-03 MED ORDER — MIDAZOLAM HCL 5 MG/5ML IJ SOLN
INTRAMUSCULAR | Status: AC
  Filled 2015-07-03: qty 10

## 2015-07-03 MED ORDER — STERILE WATER FOR IRRIGATION IR SOLN
Status: DC | PRN
  Administered 2015-07-03: 2.5 mL

## 2015-07-03 MED ORDER — MEPERIDINE HCL 50 MG/ML IJ SOLN
INTRAMUSCULAR | Status: AC
  Filled 2015-07-03: qty 1

## 2015-07-03 MED ORDER — BUTAMBEN-TETRACAINE-BENZOCAINE 2-2-14 % EX AERO
INHALATION_SPRAY | CUTANEOUS | Status: DC | PRN
  Administered 2015-07-03: 2 via TOPICAL

## 2015-07-03 MED ORDER — SODIUM CHLORIDE 0.9 % IV SOLN
INTRAVENOUS | Status: DC
  Administered 2015-07-03: 13:00:00 via INTRAVENOUS

## 2015-07-03 MED ORDER — MEPERIDINE HCL 50 MG/ML IJ SOLN
INTRAMUSCULAR | Status: DC | PRN
  Administered 2015-07-03 (×2): 25 mg via INTRAVENOUS

## 2015-07-03 NOTE — H&P (Signed)
Lindsey Evans is an 65 y.o. female.   Chief Complaint:  Patient is here for EGD and possible ED. HPI:  Patient is 65 year old African-American female was over 5 history of GERD was been experiencing intermittent solid food dysphagia for more than 6 months. She also had an episode of chest pain recently when she possibly had for impaction. She was seen by Dr. Roderic Palau branch of cardiology and pain felt to be of GI origin. She is feeling much better since Nexium dose was doubled 2 months ago. She denies nausea vomiting abdominal pain melena or rectal bleeding.  Past Medical History  Diagnosis Date  . Hypertension   . GERD (gastroesophageal reflux disease)   . Vaginal dryness 12/14/2012  . Diabetes mellitus without complication [facility]     Past Surgical History  Procedure Laterality Date  . Abdominal hysterectomy    . Cholecystectomy      Gallstones    Family History  Problem Relation Age of Onset  . Hypertension Mother   . Cancer Father     prostate  . Cancer Paternal Aunt     breast  . Diabetes Paternal Uncle    Social History:  reports that she has never smoked. She has never used smokeless tobacco. She reports that she does not drink alcohol or use illicit drugs.  Allergies: No Known Allergies  Medications Prior to Admission  Medication Sig Dispense Refill  . acetaminophen (TYLENOL) 500 MG tablet Take 500 mg by mouth daily as needed for headache.    . ALPRAZolam (XANAX) 0.5 MG tablet Take 0.5 mg by mouth as needed for anxiety.     Marland Kitchen amLODipine (NORVASC) 5 MG tablet Take 5 mg by mouth daily.    Marland Kitchen aspirin 81 MG tablet Take 81 mg by mouth daily.    Marland Kitchen atorvastatin (LIPITOR) 20 MG tablet Take 20 mg by mouth daily.    Marland Kitchen esomeprazole (NEXIUM) 40 MG capsule Take 40 mg by mouth 2 (two) times daily before a meal.     . hydrochlorothiazide (HYDRODIURIL) 25 MG tablet Take 25 mg by mouth daily.    . metFORMIN (GLUCOPHAGE) 500 MG tablet Take 500 mg by mouth 2 (two) times daily with a  meal.     . metoprolol succinate (TOPROL-XL) 50 MG 24 hr tablet Take 50 mg by mouth daily. Take with or immediately following a meal.    . sucralfate (CARAFATE) 1 G tablet Take 1 g by mouth at bedtime.       No results found for this or any previous visit (from the past 48 hour(s)). No results found.  ROS  Blood pressure 99/66, pulse 63, temperature 98.2 F (36.8 C), temperature source Oral, resp. rate 18, SpO2 100 %. Physical Exam  Constitutional: She appears well-developed and well-nourished.  HENT:  Mouth/Throat: Oropharynx is clear and moist.  Eyes: Conjunctivae are normal. No scleral icterus.  Neck: No thyromegaly present.  Cardiovascular: Normal rate, regular rhythm and normal heart sounds.   No murmur heard. Respiratory: Effort normal and breath sounds normal.  GI: Soft. She exhibits no mass. Tenderness:  mild midepigastric tenderness. There is no guarding.  Musculoskeletal: She exhibits no edema.  Lymphadenopathy:    She has no cervical adenopathy.  Neurological: She is alert.  Skin: Skin is warm and dry.     Assessment/Plan  Chronic GERD.  Solid food dysphagia noncardiac chest pain.  EGD possible EGD.  Lindsey Evans U 07/03/2015, 1:57 PM

## 2015-07-03 NOTE — Discharge Instructions (Signed)
Resume aspirin on 07/05/2015.  Resume other medications and diet as before.  No driving for 24 hours.  Physician will call with biopsy results.     Esophagogastroduodenoscopy, Care After Refer to this sheet in the next few weeks. These instructions provide you with information about caring for yourself after your procedure. Your health care provider may also give you more specific instructions. Your treatment has been planned according to current medical practices, but problems sometimes occur. Call your health care provider if you have any problems or questions after your procedure. WHAT TO EXPECT AFTER THE PROCEDURE After your procedure, it is typical to feel:  Soreness in your throat.  Pain with swallowing.  Sick to your stomach (nauseous).  Bloated.  Dizzy.  Fatigued. HOME CARE INSTRUCTIONS  Do not eat or drink anything until the numbing medicine (local anesthetic) has worn off and your gag reflex has returned. You will know that the local anesthetic has worn off when you can swallow comfortably.  Do not drive or operate machinery until directed by your health care provider.  Take medicines only as directed by your health care provider. SEEK MEDICAL CARE IF:   You cannot stop coughing.  You are not urinating at all or less than usual. SEEK IMMEDIATE MEDICAL CARE IF:  You have difficulty swallowing.  You cannot eat or drink.  You have worsening throat or chest pain.  You have dizziness or lightheadedness or you faint.  You have nausea or vomiting.  You have chills.  You have a fever.  You have severe abdominal pain.  You have black, tarry, or bloody stools.   This information is not intended to replace advice given to you by your health care provider. Make sure you discuss any questions you have with your health care provider.   Document Released: 05/24/2012 Document Revised: 06/28/2014 Document Reviewed: 05/24/2012 Elsevier Interactive Patient Education  [employer].

## 2015-07-03 NOTE — Op Note (Addendum)
EGD PROCEDURE REPORT  PATIENT:  Lindsey Evans  MR#:  [id number] Birthdate:  1950/09/13, 65 y.o., female Endoscopist:  Dr. Rogene Houston, MD Referred By:  Dr.  Glo Herring, MD Procedure Date: 07/03/2015  Procedure:   EGD with ED  Indications:   Patient is 65 year old African female 5 year history of GERD symptoms was experience intermittent solid food dysphagia for more than 6 months. she also experience an episode of chest pain felt to be noncardiac. She is not having more chest pain since Nexium dose was doubled.            Informed Consent:  The risks, benefits, alternatives & imponderables which include, but are not limited to, bleeding, infection, perforation, drug reaction and potential missed lesion have been reviewed.  The potential for biopsy, lesion removal, esophageal dilation, etc. have also been discussed.  Questions have been answered.  All parties agreeable.  Please see history & physical in medical record for more information.  Medications:  Demerol 50 mg IV Versed 10 mg IV Cetacaine spray topically for oropharyngeal anesthesia   First dose administered at 2:02 PM  Last dose administered at 2:20 PM, Scope Out 2:23 PM.  Description of procedure:  The endoscope was introduced through the mouth and advanced to the second portion of the duodenum without difficulty or limitations. The mucosal surfaces were surveyed very carefully during advancement of the scope and upon withdrawal.  Findings:  Esophagus:   Mucosa of the esophagus was normal. Wavy GE junction with 1 tongue more than 10 mm in length. No ring or stricture noted. GEJ:   37cm Stomach:   Stomach was empty and distended very well with insufflation. Folds in the proximal stomach were normal. Examination mucosa at gastric body, antrum , pyloric channel and angularis was normal.  Fine nodularity noted to fundal mucosa. Multiple biopsies taken. Duodenum:   Normal bulbar and post bulbar  mucosa.  Therapeutic/Diagnostic Maneuvers Performed:   Multiple biopsies were taken from fundal mucosa for routine histology. Esophagus was dilated by passing 54 [place] Maloney dilator to full insertion. As the dilator was withdrawn endoscope was passed again and small linear mucosal disruption noted to  cervicalesophagus indicative of disrupted web. Biopsy was also taken from GE junction for routine histology.  Complications:  None  EBL: Minimal  Impression: Wavy GE junction with single patch of salmon colored mucosa greater than 10 mm in length. Biopsy taken from this area post dilation.  No evidence of esophageal stricture or erosions.  Fine nodularity noted to fundal mucosa. Biopsies taken.  Esophageal dilation with 54 [place] Maloney dilator resulting in linear mucosal disruption  at cervical esophagus indicative of disrupted web.   Recommendations:   Standard instructions given.  I will be contacting patient with biopsy results and further recommendations.  Karris Deangelo U  07/03/2015  2:27 PM  CC: Dr. Glo Herring., MD & Dr. Rayne Du ref. provider found

## 2015-07-07 ENCOUNTER — Encounter (HOSPITAL_COMMUNITY): Payer: Self-pay | Admitting: Internal Medicine

## 2015-07-14 DIAGNOSIS — E118 Type 2 diabetes mellitus with unspecified complications: Secondary | ICD-10-CM | POA: Diagnosis not present

## 2015-07-16 DIAGNOSIS — Z23 Encounter for immunization: Secondary | ICD-10-CM | POA: Diagnosis not present

## 2015-07-16 DIAGNOSIS — L219 Seborrheic dermatitis, unspecified: Secondary | ICD-10-CM | POA: Diagnosis not present

## 2015-07-16 DIAGNOSIS — L810 Postinflammatory hyperpigmentation: Secondary | ICD-10-CM | POA: Diagnosis not present

## 2015-07-28 DIAGNOSIS — M9902 Segmental and somatic dysfunction of thoracic region: Secondary | ICD-10-CM | POA: Diagnosis not present

## 2015-07-28 DIAGNOSIS — M542 Cervicalgia: Secondary | ICD-10-CM | POA: Diagnosis not present

## 2015-07-28 DIAGNOSIS — M546 Pain in thoracic spine: Secondary | ICD-10-CM | POA: Diagnosis not present

## 2015-07-28 DIAGNOSIS — M9901 Segmental and somatic dysfunction of cervical region: Secondary | ICD-10-CM | POA: Diagnosis not present

## 2015-07-30 DIAGNOSIS — E538 Deficiency of other specified B group vitamins: Secondary | ICD-10-CM | POA: Diagnosis not present

## 2015-07-30 DIAGNOSIS — E119 Type 2 diabetes mellitus without complications: Secondary | ICD-10-CM | POA: Diagnosis not present

## 2015-07-30 DIAGNOSIS — E663 Overweight: Secondary | ICD-10-CM | POA: Diagnosis not present

## 2015-07-30 DIAGNOSIS — I10 Essential (primary) hypertension: Secondary | ICD-10-CM | POA: Diagnosis not present

## 2015-07-30 DIAGNOSIS — E785 Hyperlipidemia, unspecified: Secondary | ICD-10-CM | POA: Diagnosis not present

## 2015-07-30 DIAGNOSIS — Z1389 Encounter for screening for other disorder: Secondary | ICD-10-CM | POA: Diagnosis not present

## 2015-07-30 DIAGNOSIS — Z6828 Body mass index (BMI) 28.0-28.9, adult: Secondary | ICD-10-CM | POA: Diagnosis not present

## 2015-07-30 DIAGNOSIS — F419 Anxiety disorder, unspecified: Secondary | ICD-10-CM | POA: Diagnosis not present

## 2015-09-02 DIAGNOSIS — Z1389 Encounter for screening for other disorder: Secondary | ICD-10-CM | POA: Diagnosis not present

## 2015-09-02 DIAGNOSIS — E663 Overweight: Secondary | ICD-10-CM | POA: Diagnosis not present

## 2015-09-02 DIAGNOSIS — Z6828 Body mass index (BMI) 28.0-28.9, adult: Secondary | ICD-10-CM | POA: Diagnosis not present

## 2015-09-02 DIAGNOSIS — T148 Other injury of unspecified body region: Secondary | ICD-10-CM | POA: Diagnosis not present

## 2015-09-03 ENCOUNTER — Ambulatory Visit: Payer: Self-pay | Admitting: Advanced Practice Midwife

## 2015-09-04 DIAGNOSIS — L700 Acne vulgaris: Secondary | ICD-10-CM | POA: Diagnosis not present

## 2015-10-20 DIAGNOSIS — M9901 Segmental and somatic dysfunction of cervical region: Secondary | ICD-10-CM | POA: Diagnosis not present

## 2015-10-20 DIAGNOSIS — M9902 Segmental and somatic dysfunction of thoracic region: Secondary | ICD-10-CM | POA: Diagnosis not present

## 2015-10-20 DIAGNOSIS — M546 Pain in thoracic spine: Secondary | ICD-10-CM | POA: Diagnosis not present

## 2015-10-20 DIAGNOSIS — M542 Cervicalgia: Secondary | ICD-10-CM | POA: Diagnosis not present

## 2016-01-16 DIAGNOSIS — M9902 Segmental and somatic dysfunction of thoracic region: Secondary | ICD-10-CM | POA: Diagnosis not present

## 2016-01-16 DIAGNOSIS — M542 Cervicalgia: Secondary | ICD-10-CM | POA: Diagnosis not present

## 2016-01-16 DIAGNOSIS — M9901 Segmental and somatic dysfunction of cervical region: Secondary | ICD-10-CM | POA: Diagnosis not present

## 2016-01-16 DIAGNOSIS — M546 Pain in thoracic spine: Secondary | ICD-10-CM | POA: Diagnosis not present

## 2016-01-27 DIAGNOSIS — I10 Essential (primary) hypertension: Secondary | ICD-10-CM | POA: Diagnosis not present

## 2016-01-27 DIAGNOSIS — J9801 Acute bronchospasm: Secondary | ICD-10-CM | POA: Diagnosis not present

## 2016-01-27 DIAGNOSIS — E119 Type 2 diabetes mellitus without complications: Secondary | ICD-10-CM | POA: Diagnosis not present

## 2016-01-27 DIAGNOSIS — J0100 Acute maxillary sinusitis, unspecified: Secondary | ICD-10-CM | POA: Diagnosis not present

## 2016-01-27 DIAGNOSIS — K219 Gastro-esophageal reflux disease without esophagitis: Secondary | ICD-10-CM | POA: Diagnosis not present

## 2016-01-27 DIAGNOSIS — Z6827 Body mass index (BMI) 27.0-27.9, adult: Secondary | ICD-10-CM | POA: Diagnosis not present

## 2016-01-27 DIAGNOSIS — J069 Acute upper respiratory infection, unspecified: Secondary | ICD-10-CM | POA: Diagnosis not present

## 2016-02-11 DIAGNOSIS — E782 Mixed hyperlipidemia: Secondary | ICD-10-CM | POA: Diagnosis not present

## 2016-02-11 DIAGNOSIS — J343 Hypertrophy of nasal turbinates: Secondary | ICD-10-CM | POA: Diagnosis not present

## 2016-02-11 DIAGNOSIS — E669 Obesity, unspecified: Secondary | ICD-10-CM | POA: Diagnosis not present

## 2016-02-11 DIAGNOSIS — R062 Wheezing: Secondary | ICD-10-CM | POA: Diagnosis not present

## 2016-02-11 DIAGNOSIS — J209 Acute bronchitis, unspecified: Secondary | ICD-10-CM | POA: Diagnosis not present

## 2016-02-11 DIAGNOSIS — R070 Pain in throat: Secondary | ICD-10-CM | POA: Diagnosis not present

## 2016-02-11 DIAGNOSIS — Z6827 Body mass index (BMI) 27.0-27.9, adult: Secondary | ICD-10-CM | POA: Diagnosis not present

## 2016-02-11 DIAGNOSIS — E663 Overweight: Secondary | ICD-10-CM | POA: Diagnosis not present

## 2016-02-11 DIAGNOSIS — I10 Essential (primary) hypertension: Secondary | ICD-10-CM | POA: Diagnosis not present

## 2016-03-12 DIAGNOSIS — N952 Postmenopausal atrophic vaginitis: Secondary | ICD-10-CM | POA: Diagnosis not present

## 2016-03-12 DIAGNOSIS — Z6826 Body mass index (BMI) 26.0-26.9, adult: Secondary | ICD-10-CM | POA: Diagnosis not present

## 2016-03-12 DIAGNOSIS — E782 Mixed hyperlipidemia: Secondary | ICD-10-CM | POA: Diagnosis not present

## 2016-03-12 DIAGNOSIS — F419 Anxiety disorder, unspecified: Secondary | ICD-10-CM | POA: Diagnosis not present

## 2016-03-12 DIAGNOSIS — E538 Deficiency of other specified B group vitamins: Secondary | ICD-10-CM | POA: Diagnosis not present

## 2016-03-12 DIAGNOSIS — E663 Overweight: Secondary | ICD-10-CM | POA: Diagnosis not present

## 2016-03-12 DIAGNOSIS — E119 Type 2 diabetes mellitus without complications: Secondary | ICD-10-CM | POA: Diagnosis not present

## 2016-03-12 DIAGNOSIS — I10 Essential (primary) hypertension: Secondary | ICD-10-CM | POA: Diagnosis not present

## 2016-03-12 DIAGNOSIS — K219 Gastro-esophageal reflux disease without esophagitis: Secondary | ICD-10-CM | POA: Diagnosis not present

## 2016-03-22 ENCOUNTER — Other Ambulatory Visit: Payer: Self-pay | Admitting: Women's Health

## 2016-03-29 ENCOUNTER — Encounter: Payer: Self-pay | Admitting: Women's Health

## 2016-03-29 ENCOUNTER — Ambulatory Visit (INDEPENDENT_AMBULATORY_CARE_PROVIDER_SITE_OTHER): Payer: Medicare Other | Admitting: Women's Health

## 2016-03-29 VITALS — BP 108/66 | HR 60 | Ht 62.0 in | Wt 156.0 lb

## 2016-03-29 DIAGNOSIS — Z01419 Encounter for gynecological examination (general) (routine) without abnormal findings: Secondary | ICD-10-CM

## 2016-03-29 DIAGNOSIS — M9902 Segmental and somatic dysfunction of thoracic region: Secondary | ICD-10-CM | POA: Diagnosis not present

## 2016-03-29 DIAGNOSIS — M9901 Segmental and somatic dysfunction of cervical region: Secondary | ICD-10-CM | POA: Diagnosis not present

## 2016-03-29 DIAGNOSIS — M542 Cervicalgia: Secondary | ICD-10-CM | POA: Diagnosis not present

## 2016-03-29 DIAGNOSIS — M546 Pain in thoracic spine: Secondary | ICD-10-CM | POA: Diagnosis not present

## 2016-03-29 NOTE — Patient Instructions (Addendum)
Luvena every 3 days, then can cut back to once/wk if doing well  Use a good lubrication (Astroglide, [place] Jelly) when having sex  Consider getting your mammogram [phone]) and colonoscopy

## 2016-03-29 NOTE — Progress Notes (Signed)
Subjective:   MALAYAH DUSCH is a 65 y.o. G73P2 African American female here for a routine well-woman exam.  No LMP recorded. Patient has had a hysterectomy.    Current complaints: vaginal dryness PCP: Dr. Gerarda Fraction       Does not desire labs, done w/ PCP  The following portions of the patient's history were reviewed and updated as appropriate: allergies, current medications, past family history, past medical history, past social history, past surgical history and problem list.  Past Medical History Past Medical History:  Diagnosis Date  . Diabetes mellitus without complication ([place])   . GERD (gastroesophageal reflux disease)   . Hypertension   . Vaginal dryness 12/14/2012    Past Surgical History Past Surgical History:  Procedure Laterality Date  . ABDOMINAL HYSTERECTOMY    . CHOLECYSTECTOMY     Gallstones  . ESOPHAGOGASTRODUODENOSCOPY N/A 07/03/2015   Procedure: ESOPHAGOGASTRODUODENOSCOPY (EGD);  Surgeon: Rogene Houston, MD;  Location: [facility];  Service: Endoscopy;  Laterality: N/A;  2:40 - moved to 1:45 - Ann to notify    Gynecologic History G2P2  No LMP recorded. Patient has had a hysterectomy. Contraception: status post hysterectomy when in 30s d/t fibroids/menorrhagia Last Pap: prior to hysterectomy. Results were: normal Last mammogram: 2002. Results were: normal Last TCS: never  Does not like getting mammogram or TCS, is scared of what they may find. Discussed if she would do anything (tx like chemo/radiation) if she were to get breast or colon CA- states she has never thought about it.   Obstetric History OB History  Gravida Para Term Preterm AB Living  2 2          SAB TAB Ectopic Multiple Live Births               # Outcome Date GA Lbr Len/2nd Weight Sex Delivery Anes PTL Lv  2 Para     F Vag-Spont     1 Para     F Vag-Spont         Current Medications Current Outpatient Prescriptions on File Prior to Visit  Medication Sig Dispense Refill  .  acetaminophen (TYLENOL) 500 MG tablet Take 500 mg by mouth daily as needed for headache.    . ALPRAZolam (XANAX) 0.5 MG tablet Take 0.5 mg by mouth as needed for anxiety.     Marland Kitchen amLODipine (NORVASC) 5 MG tablet Take 5 mg by mouth daily.    Marland Kitchen aspirin 81 MG tablet Take 81 mg by mouth daily.    Marland Kitchen esomeprazole (NEXIUM) 40 MG capsule Take 40 mg by mouth 2 (two) times daily before a meal.     . hydrochlorothiazide (HYDRODIURIL) 25 MG tablet Take 25 mg by mouth daily.    . metoprolol succinate (TOPROL-XL) 50 MG 24 hr tablet Take 50 mg by mouth daily. Take with or immediately following a meal.    . atorvastatin (LIPITOR) 20 MG tablet Take 20 mg by mouth daily.    . metFORMIN (GLUCOPHAGE) 500 MG tablet Take 500 mg by mouth 2 (two) times daily with a meal.     . sucralfate (CARAFATE) 1 G tablet Take 1 g by mouth at bedtime.      No current facility-administered medications on file prior to visit.     Review of Systems Patient denies any headaches, blurred vision, shortness of breath, chest pain, abdominal pain, problems with bowel movements, urination, or intercourse.  Objective:  BP 108/66 (BP Location: Right Arm, Patient Position: Sitting, Cuff Size:  Normal)   Pulse 60   Ht 5\' 2"  (1.575 m)   Wt 156 lb (70.8 kg)   BMI 28.53 kg/m  Physical Exam  General:  Well developed, well nourished, no acute distress. She is alert and oriented x3. Skin:  Warm and dry Neck:  Midline trachea, no thyromegaly or nodules Cardiovascular: Regular rate and rhythm, no murmur heard Lungs:  Effort normal, all lung fields clear to auscultation bilaterally Breasts:  No dominant palpable mass, retraction, or nipple discharge Abdomen:  Soft, non tender, no hepatosplenomegaly or masses Pelvic:  External genitalia is normal in appearance.  The vagina is normal in appearance. Loss of rugae c/w age. The cervix is surgically absent.  Thin prep pap is not done. Uterus is surgically absent.  No adnexal masses or tenderness  noted. Extremities:  No swelling or varicosities noted Psych:  She has a normal mood and affect  Assessment:   Healthy well-woman exam Non-compliant w/ mammogram/TCS Vaginal dryness S/P hysterectomy  Plan:  Luvena q 3d for vaginal dryness, can decrease to once/wk if working well Astroglide/[place]/good lubricant for sex Strongly advised mammogram/TCS- discussed benefits to early breast/colon CA detection Gave # to [facility] radiology to schedule mammogram if she decides  If decides for TCS to let PCP or me know so we can send referral  F/U 86yr for physical, or sooner if needed   Tawnya Crook CNM, WHNP-BC 03/29/2016 2:50 PM

## 2016-07-09 ENCOUNTER — Other Ambulatory Visit (HOSPITAL_COMMUNITY): Payer: Self-pay | Admitting: Internal Medicine

## 2016-07-09 DIAGNOSIS — R52 Pain, unspecified: Secondary | ICD-10-CM

## 2016-07-12 ENCOUNTER — Telehealth (INDEPENDENT_AMBULATORY_CARE_PROVIDER_SITE_OTHER): Payer: Self-pay | Admitting: *Deleted

## 2016-07-12 NOTE — Telephone Encounter (Signed)
Was taking nexium, insurance no longer covers this, was changed to sucralfate 1 g at bedtime and esomeprazole 40 mg bid, these help some but don't seem to be strong enough, is there something else she can try  Ph# [phone] (pt aware may be Wednesday before she gets a return call)

## 2016-07-12 NOTE — Telephone Encounter (Signed)
This will be addressed with Dr.Rehman on 07/13/2016. Patient will be contacted with his recommendation.

## 2016-07-13 NOTE — Telephone Encounter (Signed)
Per Dr.Rehman she may try the Dexilant 60 mg - 1 po Qd

## 2016-07-14 ENCOUNTER — Other Ambulatory Visit (INDEPENDENT_AMBULATORY_CARE_PROVIDER_SITE_OTHER): Payer: Self-pay | Admitting: *Deleted

## 2016-07-14 DIAGNOSIS — K219 Gastro-esophageal reflux disease without esophagitis: Secondary | ICD-10-CM

## 2016-07-14 MED ORDER — DEXLANSOPRAZOLE 60 MG PO CPDR: 60.0000 mg | DELAYED_RELEASE_CAPSULE | Freq: Every day | ORAL | 3 refills | Status: DC

## 2016-07-14 NOTE — Telephone Encounter (Signed)
Patient was called and made aware that a new Rx was sent in per Dr.Rehman to [facility].

## 2016-07-20 ENCOUNTER — Encounter (HOSPITAL_COMMUNITY): Payer: Medicare Other

## 2016-08-06 ENCOUNTER — Ambulatory Visit
Admission: RE | Admit: 2016-08-06 | Discharge: 2016-08-06 | Disposition: A | Discharge status: Home / Self Care | Payer: Medicare Other | Source: Ambulatory Visit | Attending: Internal Medicine | Admitting: Internal Medicine

## 2016-08-06 DIAGNOSIS — N6011 Diffuse cystic mastopathy of right breast: Secondary | ICD-10-CM | POA: Diagnosis not present

## 2016-08-06 DIAGNOSIS — N6311 Unspecified lump in the right breast, upper outer quadrant: Secondary | ICD-10-CM | POA: Diagnosis not present

## 2016-08-06 DIAGNOSIS — R52 Pain, unspecified: Secondary | ICD-10-CM

## 2016-08-12 DIAGNOSIS — M546 Pain in thoracic spine: Secondary | ICD-10-CM | POA: Diagnosis not present

## 2016-08-12 DIAGNOSIS — M542 Cervicalgia: Secondary | ICD-10-CM | POA: Diagnosis not present

## 2016-08-12 DIAGNOSIS — M9902 Segmental and somatic dysfunction of thoracic region: Secondary | ICD-10-CM | POA: Diagnosis not present

## 2016-08-12 DIAGNOSIS — M9901 Segmental and somatic dysfunction of cervical region: Secondary | ICD-10-CM | POA: Diagnosis not present

## 2016-08-16 DIAGNOSIS — M542 Cervicalgia: Secondary | ICD-10-CM | POA: Diagnosis not present

## 2016-08-16 DIAGNOSIS — M9901 Segmental and somatic dysfunction of cervical region: Secondary | ICD-10-CM | POA: Diagnosis not present

## 2016-08-16 DIAGNOSIS — M9902 Segmental and somatic dysfunction of thoracic region: Secondary | ICD-10-CM | POA: Diagnosis not present

## 2016-08-16 DIAGNOSIS — M546 Pain in thoracic spine: Secondary | ICD-10-CM | POA: Diagnosis not present

## 2016-08-23 DIAGNOSIS — E538 Deficiency of other specified B group vitamins: Secondary | ICD-10-CM | POA: Diagnosis not present

## 2016-08-23 DIAGNOSIS — I10 Essential (primary) hypertension: Secondary | ICD-10-CM | POA: Diagnosis not present

## 2016-08-23 DIAGNOSIS — Z6828 Body mass index (BMI) 28.0-28.9, adult: Secondary | ICD-10-CM | POA: Diagnosis not present

## 2016-08-23 DIAGNOSIS — M546 Pain in thoracic spine: Secondary | ICD-10-CM | POA: Diagnosis not present

## 2016-08-23 DIAGNOSIS — K224 Dyskinesia of esophagus: Secondary | ICD-10-CM | POA: Diagnosis not present

## 2016-08-23 DIAGNOSIS — E663 Overweight: Secondary | ICD-10-CM | POA: Diagnosis not present

## 2016-08-23 DIAGNOSIS — M542 Cervicalgia: Secondary | ICD-10-CM | POA: Diagnosis not present

## 2016-08-23 DIAGNOSIS — K219 Gastro-esophageal reflux disease without esophagitis: Secondary | ICD-10-CM | POA: Diagnosis not present

## 2016-08-23 DIAGNOSIS — Z1389 Encounter for screening for other disorder: Secondary | ICD-10-CM | POA: Diagnosis not present

## 2016-08-23 DIAGNOSIS — E1165 Type 2 diabetes mellitus with hyperglycemia: Secondary | ICD-10-CM | POA: Diagnosis not present

## 2016-08-23 DIAGNOSIS — M9902 Segmental and somatic dysfunction of thoracic region: Secondary | ICD-10-CM | POA: Diagnosis not present

## 2016-08-23 DIAGNOSIS — M9901 Segmental and somatic dysfunction of cervical region: Secondary | ICD-10-CM | POA: Diagnosis not present

## 2016-08-23 DIAGNOSIS — E119 Type 2 diabetes mellitus without complications: Secondary | ICD-10-CM | POA: Diagnosis not present

## 2016-09-09 DIAGNOSIS — L710 Perioral dermatitis: Secondary | ICD-10-CM | POA: Diagnosis not present

## 2016-09-29 ENCOUNTER — Ambulatory Visit: Payer: Medicare Other | Admitting: Cardiology

## 2016-09-30 ENCOUNTER — Ambulatory Visit (INDEPENDENT_AMBULATORY_CARE_PROVIDER_SITE_OTHER): Payer: Medicare Other | Admitting: Cardiology

## 2016-09-30 ENCOUNTER — Encounter: Payer: Self-pay | Admitting: Cardiology

## 2016-09-30 VITALS — BP 116/70 | HR 61 | Ht 62.0 in | Wt 165.0 lb

## 2016-09-30 DIAGNOSIS — E782 Mixed hyperlipidemia: Secondary | ICD-10-CM

## 2016-09-30 DIAGNOSIS — R0789 Other chest pain: Secondary | ICD-10-CM

## 2016-09-30 DIAGNOSIS — I10 Essential (primary) hypertension: Secondary | ICD-10-CM

## 2016-09-30 NOTE — Patient Instructions (Signed)
Medication Instructions:  Your physician recommends that you continue on your current medications as directed. Please refer to the Current Medication list given to you today.'  Labwork: I will request a copy of labs from PCP  Testing/Procedures: NONE  Follow-Up: Your physician wants you to follow-up in: 6 MONTHS .  You will receive a reminder letter in the mail two months in advance. If you don't receive a letter, please call our office to schedule the follow-up appointment.   Any Other Special Instructions Will Be Listed Below (If Applicable).     If you need a refill on your cardiac medications before your next appointment, please call your pharmacy.

## 2016-09-30 NOTE — Progress Notes (Signed)
Clinical Summary Ms. Torti is a 66 y.o.female seen today for follow up of the following medical problems.   1. Chest pain - started about 3 months ago. Midchest aching pain 2-3/10 in severity. Often occurs with laying down. Can have some mild nausea with it. Can have some dysphagia. Not positional. No relation to food. Pain can last 24 hrs nonstop. Improved with increased PPI recently and sucralfate.  - walks daily 1 mile, tolerates well without any symptoms.  - no DOE, can have some LE edema at times.  CAD risk factors: HTN, HL, DM2  - recent chest pain in March. Her PPI was changed to dexilant, symptoms improved - still has some soreness right sided. Can be positional. 2-3/10. No other associated symptoms. - no recent SOB. Walks 1 mile daily without symptoms. Pain can be constant for several hours.   2. GERD - followed by Dr Laural Golden.    3. HTN - compliant with meds. She thinks she was on lisinopril in the past but stopped due to cough - she is unsure if she has been on ARB.   4. DM2 - she is on metformin  5. Hyperlipidemia - reports side effects on multiple statins  Past Medical History:  Diagnosis Date  . Diabetes mellitus without complication ([place])   . GERD (gastroesophageal reflux disease)   . Hypertension   . Vaginal dryness 12/14/2012     No Known Allergies   Current Outpatient Prescriptions  Medication Sig Dispense Refill  . acetaminophen (TYLENOL) 500 MG tablet Take 500 mg by mouth daily as needed for headache.    . ALPRAZolam (XANAX) 0.5 MG tablet Take 0.5 mg by mouth as needed for anxiety.     Marland Kitchen amLODipine (NORVASC) 5 MG tablet Take 5 mg by mouth daily.    Marland Kitchen aspirin 81 MG tablet Take 81 mg by mouth daily.    Marland Kitchen atorvastatin (LIPITOR) 20 MG tablet Take 20 mg by mouth daily.    Marland Kitchen dexlansoprazole (DEXILANT) 60 MG capsule Take 1 capsule (60 mg total) by mouth daily. 90 capsule 3  . esomeprazole (NEXIUM) 40 MG capsule Take 40 mg by mouth 2 (two) times daily  before a meal.     . hydrochlorothiazide (HYDRODIURIL) 25 MG tablet Take 25 mg by mouth daily.    . metFORMIN (GLUCOPHAGE) 500 MG tablet Take 500 mg by mouth 2 (two) times daily with a meal.     . metoprolol succinate (TOPROL-XL) 50 MG 24 hr tablet Take 50 mg by mouth daily. Take with or immediately following a meal.    . sucralfate (CARAFATE) 1 G tablet Take 1 g by mouth at bedtime.      No current facility-administered medications for this visit.      Past Surgical History:  Procedure Laterality Date  . ABDOMINAL HYSTERECTOMY    . CHOLECYSTECTOMY     Gallstones  . ESOPHAGOGASTRODUODENOSCOPY N/A 07/03/2015   Procedure: ESOPHAGOGASTRODUODENOSCOPY (EGD);  Surgeon: Rogene Houston, MD;  Location: [facility];  Service: Endoscopy;  Laterality: N/A;  2:40 - moved to 1:45 - Ann to notify     No Known Allergies    Family History  Problem Relation Age of Onset  . Hypertension Mother   . Cancer Father     prostate  . Cancer Paternal Aunt     breast  . Diabetes Paternal Uncle      Social History Ms. Roseman reports that she has never smoked. She has never used smokeless  tobacco. Ms. Leaton reports that she does not drink alcohol.   Review of Systems CONSTITUTIONAL: No weight loss, fever, chills, weakness or fatigue.  HEENT: Eyes: No visual loss, blurred vision, double vision or yellow sclerae.No hearing loss, sneezing, congestion, runny nose or sore throat.  SKIN: No rash or itching.  CARDIOVASCULAR: per hpi RESPIRATORY: No shortness of breath, cough or sputum.  GASTROINTESTINAL: No anorexia, nausea, vomiting or diarrhea. No abdominal pain or blood.  GENITOURINARY: No burning on urination, no polyuria NEUROLOGICAL: No headache, dizziness, syncope, paralysis, ataxia, numbness or tingling in the extremities. No change in bowel or bladder control.  MUSCULOSKELETAL: No muscle, back pain, joint pain or stiffness.  LYMPHATICS: No enlarged nodes. No history of splenectomy.    PSYCHIATRIC: No history of depression or anxiety.  ENDOCRINOLOGIC: No reports of sweating, cold or heat intolerance. No polyuria or polydipsia.  Marland Kitchen   Physical Examination Vitals:   09/30/16 0839  BP: 116/70  Pulse: 61   Vitals:   09/30/16 0839  Weight: 165 lb (74.8 kg)  Height: 5\' 2"  (1.575 m)    Gen: resting comfortably, no acute distress HEENT: no scleral icterus, pupils equal round and reactive, no palptable cervical adenopathy,  CV: RRR, 2/6 systolic murmur RUSB, no jvd Resp: Clear to auscultation bilaterally GI: abdomen is soft, non-tender, non-distended, normal bowel sounds, no hepatosplenomegaly MSK: extremities are warm, no edema.  Skin: warm, no rash Neuro:  no focal deficits Psych: appropriate affect   Diagnostic Studies  05/2015 echo Study Conclusions  - Left ventricle: The cavity size was normal. Wall thickness was   increased in a pattern of mild LVH. Systolic function was normal.   The estimated ejection fraction was in the range of 60% to 65%.   Wall motion was normal; there were no regional wall motion   abnormalities. Doppler parameters are consistent with abnormal   left ventricular relaxation (grade 1 diastolic dysfunction). - Aortic valve: Mildly calcified annulus. Trileaflet; mildly   thickened leaflets. There was mild stenosis. Mean gradient (S): 8   mm Hg. Valve area (VTI): 1.82 cm^2. Valve area (Vmax): 1.64 cm^2. - Mitral valve: Mildly calcified annulus. Mildly thickened leaflets   . - Atrial septum: No defect or patent foramen ovale was identified. - Systemic veins: The IVC is small, suggesting low RA pressure and   hypovolemia. - Technically adequate study.   Assessment and Plan   1. . Chest pain - noncardiac in description, continue to monitor at this time.   2. HTN - at goal, continue current meds. Recommend considering ARB given her DM2 history  3. Hyperlipidemia - recommend considering zetia due to statin  allergy        Arnoldo Lenis, M.D.

## 2016-10-14 DIAGNOSIS — E119 Type 2 diabetes mellitus without complications: Secondary | ICD-10-CM | POA: Diagnosis not present

## 2016-10-14 DIAGNOSIS — E663 Overweight: Secondary | ICD-10-CM | POA: Diagnosis not present

## 2016-10-14 DIAGNOSIS — B353 Tinea pedis: Secondary | ICD-10-CM | POA: Diagnosis not present

## 2016-10-14 DIAGNOSIS — I10 Essential (primary) hypertension: Secondary | ICD-10-CM | POA: Diagnosis not present

## 2016-10-14 DIAGNOSIS — H6123 Impacted cerumen, bilateral: Secondary | ICD-10-CM | POA: Diagnosis not present

## 2016-10-14 DIAGNOSIS — Z6828 Body mass index (BMI) 28.0-28.9, adult: Secondary | ICD-10-CM | POA: Diagnosis not present

## 2016-10-14 DIAGNOSIS — Z1329 Encounter for screening for other suspected endocrine disorder: Secondary | ICD-10-CM | POA: Diagnosis not present

## 2016-10-14 DIAGNOSIS — E039 Hypothyroidism, unspecified: Secondary | ICD-10-CM | POA: Diagnosis not present

## 2016-10-14 DIAGNOSIS — D519 Vitamin B12 deficiency anemia, unspecified: Secondary | ICD-10-CM | POA: Diagnosis not present

## 2016-10-14 DIAGNOSIS — E538 Deficiency of other specified B group vitamins: Secondary | ICD-10-CM | POA: Diagnosis not present

## 2016-11-18 DIAGNOSIS — E538 Deficiency of other specified B group vitamins: Secondary | ICD-10-CM | POA: Diagnosis not present

## 2016-11-22 DIAGNOSIS — M9901 Segmental and somatic dysfunction of cervical region: Secondary | ICD-10-CM | POA: Diagnosis not present

## 2016-11-22 DIAGNOSIS — M9902 Segmental and somatic dysfunction of thoracic region: Secondary | ICD-10-CM | POA: Diagnosis not present

## 2016-11-22 DIAGNOSIS — M546 Pain in thoracic spine: Secondary | ICD-10-CM | POA: Diagnosis not present

## 2016-11-22 DIAGNOSIS — M542 Cervicalgia: Secondary | ICD-10-CM | POA: Diagnosis not present

## 2017-01-03 DIAGNOSIS — D510 Vitamin B12 deficiency anemia due to intrinsic factor deficiency: Secondary | ICD-10-CM | POA: Diagnosis not present

## 2017-01-05 DIAGNOSIS — E663 Overweight: Secondary | ICD-10-CM | POA: Diagnosis not present

## 2017-01-05 DIAGNOSIS — I872 Venous insufficiency (chronic) (peripheral): Secondary | ICD-10-CM | POA: Diagnosis not present

## 2017-01-05 DIAGNOSIS — R600 Localized edema: Secondary | ICD-10-CM | POA: Diagnosis not present

## 2017-01-05 DIAGNOSIS — E063 Autoimmune thyroiditis: Secondary | ICD-10-CM | POA: Diagnosis not present

## 2017-01-05 DIAGNOSIS — Z6829 Body mass index (BMI) 29.0-29.9, adult: Secondary | ICD-10-CM | POA: Diagnosis not present

## 2017-01-05 DIAGNOSIS — Z0000 Encounter for general adult medical examination without abnormal findings: Secondary | ICD-10-CM | POA: Diagnosis not present

## 2017-01-05 DIAGNOSIS — I10 Essential (primary) hypertension: Secondary | ICD-10-CM | POA: Diagnosis not present

## 2017-01-06 DIAGNOSIS — R7309 Other abnormal glucose: Secondary | ICD-10-CM | POA: Diagnosis not present

## 2017-01-06 DIAGNOSIS — E663 Overweight: Secondary | ICD-10-CM | POA: Diagnosis not present

## 2017-01-06 DIAGNOSIS — Z6829 Body mass index (BMI) 29.0-29.9, adult: Secondary | ICD-10-CM | POA: Diagnosis not present

## 2017-01-06 DIAGNOSIS — Z0000 Encounter for general adult medical examination without abnormal findings: Secondary | ICD-10-CM | POA: Diagnosis not present

## 2017-01-06 DIAGNOSIS — Z1389 Encounter for screening for other disorder: Secondary | ICD-10-CM | POA: Diagnosis not present

## 2017-01-13 DIAGNOSIS — M546 Pain in thoracic spine: Secondary | ICD-10-CM | POA: Diagnosis not present

## 2017-01-13 DIAGNOSIS — M9901 Segmental and somatic dysfunction of cervical region: Secondary | ICD-10-CM | POA: Diagnosis not present

## 2017-01-13 DIAGNOSIS — M9902 Segmental and somatic dysfunction of thoracic region: Secondary | ICD-10-CM | POA: Diagnosis not present

## 2017-01-13 DIAGNOSIS — M542 Cervicalgia: Secondary | ICD-10-CM | POA: Diagnosis not present

## 2017-02-10 DIAGNOSIS — D510 Vitamin B12 deficiency anemia due to intrinsic factor deficiency: Secondary | ICD-10-CM | POA: Diagnosis not present

## 2017-03-28 DIAGNOSIS — I10 Essential (primary) hypertension: Secondary | ICD-10-CM | POA: Diagnosis not present

## 2017-03-28 DIAGNOSIS — E538 Deficiency of other specified B group vitamins: Secondary | ICD-10-CM | POA: Diagnosis not present

## 2017-03-28 DIAGNOSIS — E2839 Other primary ovarian failure: Secondary | ICD-10-CM | POA: Diagnosis not present

## 2017-03-28 DIAGNOSIS — K219 Gastro-esophageal reflux disease without esophagitis: Secondary | ICD-10-CM | POA: Diagnosis not present

## 2017-03-28 DIAGNOSIS — E119 Type 2 diabetes mellitus without complications: Secondary | ICD-10-CM | POA: Diagnosis not present

## 2017-03-28 DIAGNOSIS — M9902 Segmental and somatic dysfunction of thoracic region: Secondary | ICD-10-CM | POA: Diagnosis not present

## 2017-03-28 DIAGNOSIS — M542 Cervicalgia: Secondary | ICD-10-CM | POA: Diagnosis not present

## 2017-03-28 DIAGNOSIS — M9901 Segmental and somatic dysfunction of cervical region: Secondary | ICD-10-CM | POA: Diagnosis not present

## 2017-03-28 DIAGNOSIS — M546 Pain in thoracic spine: Secondary | ICD-10-CM | POA: Diagnosis not present

## 2017-03-28 DIAGNOSIS — Z6828 Body mass index (BMI) 28.0-28.9, adult: Secondary | ICD-10-CM | POA: Diagnosis not present

## 2017-03-28 DIAGNOSIS — R600 Localized edema: Secondary | ICD-10-CM | POA: Diagnosis not present

## 2017-03-28 DIAGNOSIS — L709 Acne, unspecified: Secondary | ICD-10-CM | POA: Diagnosis not present

## 2017-03-28 DIAGNOSIS — F419 Anxiety disorder, unspecified: Secondary | ICD-10-CM | POA: Diagnosis not present

## 2017-03-28 DIAGNOSIS — Z23 Encounter for immunization: Secondary | ICD-10-CM | POA: Diagnosis not present

## 2017-03-31 DIAGNOSIS — Z1211 Encounter for screening for malignant neoplasm of colon: Secondary | ICD-10-CM | POA: Diagnosis not present

## 2017-04-11 DIAGNOSIS — M9901 Segmental and somatic dysfunction of cervical region: Secondary | ICD-10-CM | POA: Diagnosis not present

## 2017-04-11 DIAGNOSIS — M9902 Segmental and somatic dysfunction of thoracic region: Secondary | ICD-10-CM | POA: Diagnosis not present

## 2017-04-11 DIAGNOSIS — M542 Cervicalgia: Secondary | ICD-10-CM | POA: Diagnosis not present

## 2017-04-11 DIAGNOSIS — M546 Pain in thoracic spine: Secondary | ICD-10-CM | POA: Diagnosis not present

## 2017-04-26 DIAGNOSIS — E2839 Other primary ovarian failure: Secondary | ICD-10-CM | POA: Diagnosis not present

## 2017-04-26 DIAGNOSIS — Z780 Asymptomatic menopausal state: Secondary | ICD-10-CM | POA: Diagnosis not present

## 2017-05-17 DIAGNOSIS — H11002 Unspecified pterygium of left eye: Secondary | ICD-10-CM | POA: Diagnosis not present

## 2017-05-17 DIAGNOSIS — D510 Vitamin B12 deficiency anemia due to intrinsic factor deficiency: Secondary | ICD-10-CM | POA: Diagnosis not present

## 2017-05-17 DIAGNOSIS — H11151 Pinguecula, right eye: Secondary | ICD-10-CM | POA: Diagnosis not present

## 2017-05-17 DIAGNOSIS — H11152 Pinguecula, left eye: Secondary | ICD-10-CM | POA: Diagnosis not present

## 2017-07-05 DIAGNOSIS — D510 Vitamin B12 deficiency anemia due to intrinsic factor deficiency: Secondary | ICD-10-CM | POA: Diagnosis not present

## 2017-07-08 DIAGNOSIS — M546 Pain in thoracic spine: Secondary | ICD-10-CM | POA: Diagnosis not present

## 2017-07-08 DIAGNOSIS — M9901 Segmental and somatic dysfunction of cervical region: Secondary | ICD-10-CM | POA: Diagnosis not present

## 2017-07-08 DIAGNOSIS — M9902 Segmental and somatic dysfunction of thoracic region: Secondary | ICD-10-CM | POA: Diagnosis not present

## 2017-07-08 DIAGNOSIS — M542 Cervicalgia: Secondary | ICD-10-CM | POA: Diagnosis not present

## 2017-08-30 DIAGNOSIS — E538 Deficiency of other specified B group vitamins: Secondary | ICD-10-CM | POA: Diagnosis not present

## 2017-10-21 DIAGNOSIS — Z6828 Body mass index (BMI) 28.0-28.9, adult: Secondary | ICD-10-CM | POA: Diagnosis not present

## 2017-10-21 DIAGNOSIS — B355 Tinea imbricata: Secondary | ICD-10-CM | POA: Diagnosis not present

## 2017-10-21 DIAGNOSIS — M546 Pain in thoracic spine: Secondary | ICD-10-CM | POA: Diagnosis not present

## 2017-10-21 DIAGNOSIS — M542 Cervicalgia: Secondary | ICD-10-CM | POA: Diagnosis not present

## 2017-10-21 DIAGNOSIS — M9902 Segmental and somatic dysfunction of thoracic region: Secondary | ICD-10-CM | POA: Diagnosis not present

## 2017-10-21 DIAGNOSIS — E663 Overweight: Secondary | ICD-10-CM | POA: Diagnosis not present

## 2017-10-21 DIAGNOSIS — M9901 Segmental and somatic dysfunction of cervical region: Secondary | ICD-10-CM | POA: Diagnosis not present

## 2017-10-27 DIAGNOSIS — Z681 Body mass index (BMI) 19 or less, adult: Secondary | ICD-10-CM | POA: Diagnosis not present

## 2017-10-27 DIAGNOSIS — E538 Deficiency of other specified B group vitamins: Secondary | ICD-10-CM | POA: Diagnosis not present

## 2017-10-27 DIAGNOSIS — E1165 Type 2 diabetes mellitus with hyperglycemia: Secondary | ICD-10-CM | POA: Diagnosis not present

## 2017-10-27 DIAGNOSIS — I10 Essential (primary) hypertension: Secondary | ICD-10-CM | POA: Diagnosis not present

## 2017-10-27 DIAGNOSIS — M2550 Pain in unspecified joint: Secondary | ICD-10-CM | POA: Diagnosis not present

## 2017-10-27 DIAGNOSIS — Z1389 Encounter for screening for other disorder: Secondary | ICD-10-CM | POA: Diagnosis not present

## 2017-10-27 DIAGNOSIS — M6530 Trigger finger, unspecified finger: Secondary | ICD-10-CM | POA: Diagnosis not present

## 2017-10-31 DIAGNOSIS — E119 Type 2 diabetes mellitus without complications: Secondary | ICD-10-CM | POA: Diagnosis not present

## 2017-12-12 ENCOUNTER — Ambulatory Visit: Payer: Medicare Other | Admitting: Orthopedic Surgery

## 2017-12-13 ENCOUNTER — Ambulatory Visit (INDEPENDENT_AMBULATORY_CARE_PROVIDER_SITE_OTHER): Payer: Medicare Other | Admitting: Orthopedic Surgery

## 2017-12-13 ENCOUNTER — Encounter: Payer: Self-pay | Admitting: Orthopedic Surgery

## 2017-12-13 VITALS — BP 127/82 | HR 60 | Ht 62.0 in | Wt 153.0 lb

## 2017-12-13 DIAGNOSIS — M65331 Trigger finger, right middle finger: Secondary | ICD-10-CM | POA: Diagnosis not present

## 2017-12-13 NOTE — Patient Instructions (Signed)
Trigger Finger Trigger finger (stenosing tenosynovitis) is a condition that causes a finger to get stuck in a bent position. Each finger has a tough, cord-like tissue that connects muscle to bone (tendon), and each tendon is surrounded by a tunnel of tissue (tendon sheath). To move your finger, your tendon needs to slide freely through the sheath. Trigger finger happens when the tendon or the sheath thickens, making it difficult to move your finger. Trigger finger can affect any finger or a thumb. It may affect more than one finger. Mild cases may clear up with rest and medicine. Severe cases require more treatment. What are the causes? Trigger finger is caused by a thickened finger tendon or tendon sheath. The cause of this thickening is not known. What increases the risk? The following factors may make you more likely to develop this condition:  Doing activities that require a strong grip.  Having rheumatoid arthritis, gout, or diabetes.  Being 40-60 years old.  Being a woman.  What are the signs or symptoms? Symptoms of this condition include:  Pain when bending or straightening your finger.  Tenderness or swelling where your finger attaches to the palm of your hand.  A lump in the palm of your hand or on the inside of your finger.  Hearing a popping sound when you try to straighten your finger.  Feeling a popping, catching, or locking sensation when you try to straighten your finger.  Being unable to straighten your finger.  How is this diagnosed? This condition is diagnosed based on your symptoms and a physical exam. How is this treated? This condition may be treated by:  Resting your finger and avoiding activities that make symptoms worse.  Wearing a finger splint to keep your finger in a slightly bent position.  Taking NSAIDs to relieve pain and swelling.  Injecting medicine (steroids) into the tendon sheath to reduce swelling and irritation. Injections may need to be  repeated.  Having surgery to open the tendon sheath. This may be done if other treatments do not work and you cannot straighten your finger. You may need physical therapy after surgery.  Follow these instructions at home:  Use moist heat to help reduce pain and swelling as told by your health care provider.  Rest your finger and avoid activities that make pain worse. Return to normal activities as told by your health care provider.  If you have a splint, wear it as told by your health care provider.  Take over-the-counter and prescription medicines only as told by your health care provider.  Keep all follow-up visits as told by your health care provider. This is important. Contact a health care provider if:  Your symptoms are not improving with home care. Summary  Trigger finger (stenosing tenosynovitis) causes your finger to get stuck in a bent position, and it can make it difficult and painful to straighten your finger.  This condition develops when a finger tendon or tendon sheath thickens.  Treatment starts with resting, wearing a splint, and taking NSAIDs.  In severe cases, surgery to open the tendon sheath may be needed. This information is not intended to replace advice given to you by your health care provider. Make sure you discuss any questions you have with your health care provider. Document Released: 03/27/2004 Document Revised: 05/18/2016 Document Reviewed: 05/18/2016 Elsevier Interactive Patient Education  2017 Elsevier Inc.  

## 2017-12-13 NOTE — Progress Notes (Signed)
Patient ID: Lindsey Evans, female   DOB: 06/15/1951, 67 y.o.   MRN: [id number]  Chief Complaint  Patient presents with  . Hand Pain    right     HPI Lindsey Evans is a 67 y.o. female.  Presents for evaluation of right long finger  She is 67 years old she is had 3 to 90-month history of pain over the A1 pulley catching and locking right long/middle finger no prior treatment no trauma catching occurs primarily early in the morning and then after activity pain is mild to moderate dull and achy  Review of Systems Review of Systems  Constitutional: Negative for chills and fever.  Skin: Negative.     Past Medical History:  Diagnosis Date  . Diabetes mellitus without complication ([place])   . GERD (gastroesophageal reflux disease)   . Hypertension   . Vaginal dryness 12/14/2012    Past Surgical History:  Procedure Laterality Date  . ABDOMINAL HYSTERECTOMY    . CHOLECYSTECTOMY     Gallstones  . ESOPHAGOGASTRODUODENOSCOPY N/A 07/03/2015   Procedure: ESOPHAGOGASTRODUODENOSCOPY (EGD);  Surgeon: Rogene Houston, MD;  Location[facility];  Service: Endoscopy;  Laterality: N/A;  2:40 - moved to 1:45 - Ann to notify    Family History  Problem Relation Age of Onset  . Hypertension Mother   . Cancer Father        prostate  . Cancer Paternal Aunt        breast  . Diabetes Paternal Uncle     Social History Social History   Tobacco Use  . Smoking status: Never Smoker  . Smokeless tobacco: Never Used  Substance Use Topics  . Alcohol use: No  . Drug use: No    Allergies  Allergen Reactions  . Lipitor [Atorvastatin] Other (See Comments)    Leg cramps    Current Outpatient Medications  Medication Sig Dispense Refill  . ALPRAZolam (XANAX) 0.5 MG tablet Take 0.5 mg by mouth as needed for anxiety.     Marland Kitchen amLODipine (NORVASC) 5 MG tablet Take 5 mg by mouth daily.    . furosemide (LASIX) 20 MG tablet     . hydrochlorothiazide (HYDRODIURIL) 25 MG tablet Take 25 mg by mouth  daily.    . metoprolol succinate (TOPROL-XL) 50 MG 24 hr tablet Take 50 mg by mouth daily. Take with or immediately following a meal.    . aspirin 81 MG tablet Take 81 mg by mouth daily.    Marland Kitchen dexlansoprazole (DEXILANT) 60 MG capsule Take 1 capsule (60 mg total) by mouth daily. (Patient not taking: Reported on 12/13/2017) 90 capsule 3  . metFORMIN (GLUCOPHAGE) 500 MG tablet Take 500 mg by mouth 2 (two) times daily with a meal.      No current facility-administered medications for this visit.      Physical Exam Physical Exam Blood pressure 127/82, pulse 60, height 5\' 2"  (1.575 m), weight 153 lb (69.4 kg). Appearance, there are no abnormalities in terms of appearance the patient was well-developed and well-nourished. The grooming and hygiene were normal.  Mental status orientation, there was normal alertness and orientation Mood pleasant Ambulatory status normal with no assistive devices  Examination of the RIGHT hand reveals tenderness over the A1 pulley of the RIGHT MIDDLE/LONG  finger Range of motion remains normal with clicking on flexion extension.  Stability tests show no abnormality of the IP joints are MP joint. The FDP and FDS strength is normal Skin warm dry and intact without  laceration or ulceration or erythema Neurologic examination normal sensation Vascular examination normal pulses with warm extremity and normal capillary refill  The opposite extremity normal range of motion no tenderness swelling instability atrophy.  Neurovascular exam is intact    MEDICAL DECISION SECTION  xrays ordered? NO  My independent reading of xrays: N/A   Encounter Diagnosis  Name Primary?  . Trigger finger, right middle finger Yes     PLAN:   No orders of the defined types were placed in this encounter.  Injection? YES  Trigger finger injection  Diagnosis tenosynovitis right middle/long finger Procedure injection A1 pulley Medications lidocaine 1% 1 mL and Depo-Medrol 40 mg 1  mL Skin prep alcohol and ethyl chloride Verbal consent was obtained Timeout confirmed the injection site  After cleaning the skin with alcohol and anesthetizing the skin with ethyl chloride the A1 pulley was palpated and the injection was performed without complication MRI/CT/? NO

## 2018-01-09 DIAGNOSIS — H9201 Otalgia, right ear: Secondary | ICD-10-CM | POA: Diagnosis not present

## 2018-01-09 DIAGNOSIS — T161XXA Foreign body in right ear, initial encounter: Secondary | ICD-10-CM | POA: Diagnosis not present

## 2018-01-23 DIAGNOSIS — E538 Deficiency of other specified B group vitamins: Secondary | ICD-10-CM | POA: Diagnosis not present

## 2018-01-23 DIAGNOSIS — M9901 Segmental and somatic dysfunction of cervical region: Secondary | ICD-10-CM | POA: Diagnosis not present

## 2018-01-23 DIAGNOSIS — M546 Pain in thoracic spine: Secondary | ICD-10-CM | POA: Diagnosis not present

## 2018-01-23 DIAGNOSIS — M9902 Segmental and somatic dysfunction of thoracic region: Secondary | ICD-10-CM | POA: Diagnosis not present

## 2018-01-23 DIAGNOSIS — M542 Cervicalgia: Secondary | ICD-10-CM | POA: Diagnosis not present

## 2018-01-30 DIAGNOSIS — M546 Pain in thoracic spine: Secondary | ICD-10-CM | POA: Diagnosis not present

## 2018-01-30 DIAGNOSIS — M9901 Segmental and somatic dysfunction of cervical region: Secondary | ICD-10-CM | POA: Diagnosis not present

## 2018-01-30 DIAGNOSIS — M542 Cervicalgia: Secondary | ICD-10-CM | POA: Diagnosis not present

## 2018-01-30 DIAGNOSIS — M9902 Segmental and somatic dysfunction of thoracic region: Secondary | ICD-10-CM | POA: Diagnosis not present

## 2018-02-03 ENCOUNTER — Encounter (HOSPITAL_COMMUNITY): Payer: Self-pay

## 2018-02-03 ENCOUNTER — Other Ambulatory Visit: Payer: Self-pay

## 2018-02-03 ENCOUNTER — Emergency Department (HOSPITAL_COMMUNITY): Payer: Medicare Other

## 2018-02-03 ENCOUNTER — Emergency Department (HOSPITAL_COMMUNITY)
Admission: EM | Admit: 2018-02-03 | Discharge: 2018-02-03 | Disposition: A | Discharge status: Home / Self Care | Payer: Medicare Other | Attending: Emergency Medicine | Admitting: Emergency Medicine

## 2018-02-03 DIAGNOSIS — E119 Type 2 diabetes mellitus without complications: Secondary | ICD-10-CM | POA: Insufficient documentation

## 2018-02-03 DIAGNOSIS — I10 Essential (primary) hypertension: Secondary | ICD-10-CM | POA: Insufficient documentation

## 2018-02-03 DIAGNOSIS — M5030 Other cervical disc degeneration, unspecified cervical region: Secondary | ICD-10-CM

## 2018-02-03 DIAGNOSIS — Z7982 Long term (current) use of aspirin: Secondary | ICD-10-CM | POA: Diagnosis not present

## 2018-02-03 DIAGNOSIS — M5412 Radiculopathy, cervical region: Secondary | ICD-10-CM

## 2018-02-03 DIAGNOSIS — M542 Cervicalgia: Secondary | ICD-10-CM | POA: Diagnosis not present

## 2018-02-03 DIAGNOSIS — Z7984 Long term (current) use of oral hypoglycemic drugs: Secondary | ICD-10-CM | POA: Diagnosis not present

## 2018-02-03 DIAGNOSIS — R202 Paresthesia of skin: Secondary | ICD-10-CM | POA: Diagnosis not present

## 2018-02-03 DIAGNOSIS — R200 Anesthesia of skin: Secondary | ICD-10-CM | POA: Diagnosis not present

## 2018-02-03 DIAGNOSIS — Z79899 Other long term (current) drug therapy: Secondary | ICD-10-CM | POA: Diagnosis not present

## 2018-02-03 HISTORY — DX: Cervicalgia: M54.2

## 2018-02-03 LAB — CBC WITH DIFFERENTIAL/PLATELET
BASOS PCT: 1 %
Basophils Absolute: 0 10*3/uL (ref 0.0–0.1)
EOS PCT: 5 %
Eosinophils Absolute: 0.4 10*3/uL (ref 0.0–0.7)
HCT: 42 % (ref 36.0–46.0)
HEMOGLOBIN: 13.7 g/dL (ref 12.0–15.0)
LYMPHS ABS: 3.4 10*3/uL (ref 0.7–4.0)
Lymphocytes Relative: 42 %
MCH: 27.7 pg (ref 26.0–34.0)
MCHC: 32.6 g/dL (ref 30.0–36.0)
MCV: 85 fL (ref 78.0–100.0)
MONO ABS: 0.5 10*3/uL (ref 0.1–1.0)
MONOS PCT: 7 %
NEUTROS PCT: 45 %
Neutro Abs: 3.7 10*3/uL (ref 1.7–7.7)
PLATELETS: 314 10*3/uL (ref 150–400)
RBC: 4.94 MIL/uL (ref 3.87–5.11)
RDW: 14.8 % (ref 11.5–15.5)
WBC: 8.1 10*3/uL (ref 4.0–10.5)

## 2018-02-03 LAB — BASIC METABOLIC PANEL
Anion gap: 8 (ref 5–15)
BUN: 20 mg/dL (ref 8–23)
CALCIUM: 9.6 mg/dL (ref 8.9–10.3)
CHLORIDE: 103 mmol/L (ref 98–111)
CO2: 28 mmol/L (ref 22–32)
Creatinine, Ser: 0.9 mg/dL (ref 0.44–1.00)
GFR calc Af Amer: >60 mL/min (ref >60)
GFR calc non Af Amer: >60 mL/min (ref >60)
Glucose, Bld: 125 mg/dL — ABNORMAL HIGH (ref 70–99)
Potassium: 3.6 mmol/L (ref 3.5–5.1)
Sodium: 139 mmol/L (ref 135–145)

## 2018-02-03 MED ORDER — OXYCODONE-ACETAMINOPHEN 5-325 MG PO TABS
1.0000 | ORAL_TABLET | Freq: Once | ORAL | Status: AC
  Administered 2018-02-03: 1 via ORAL
  Filled 2018-02-03: qty 1

## 2018-02-03 MED ORDER — METHOCARBAMOL 500 MG PO TABS
750.0000 mg | ORAL_TABLET | Freq: Once | ORAL | Status: AC
  Administered 2018-02-03: 750 mg via ORAL
  Filled 2018-02-03: qty 2

## 2018-02-03 MED ORDER — METHOCARBAMOL 750 MG PO TABS: 750.0000 mg | ORAL_TABLET | Freq: Three times a day (TID) | ORAL | 0 refills | Status: DC | PRN

## 2018-02-03 MED ORDER — HYDROCODONE-ACETAMINOPHEN 5-325 MG PO TABS: ORAL_TABLET | ORAL | 0 refills | Status: DC

## 2018-02-03 NOTE — ED Triage Notes (Addendum)
Pt reports that she has intermittent tingling in her right hand and arm for several weeks. Pt reports her neck has been hurting and she sees a chiropractor for her pain for adjustments. Pt instructed to come to ED by Dr Gerarda Fraction

## 2018-02-03 NOTE — ED Notes (Signed)
Pt denies any tingling or pain at this time.

## 2018-02-03 NOTE — Discharge Instructions (Signed)
Take the prescriptions as directed.  Apply moist heat or ice to the area(s) of discomfort, for 15 minutes at a time, several times per day for the next few days.  Do not fall asleep on a heating or ice pack.  Do not have any more chiropractic adjustments until you are seen in follow up by the Neurosurgeon.  Call your regular medical doctor today to schedule a follow up appointment within the next 3 days. Call the Neurosurgeon today to schedule a follow up appointment within the next week.  Return to the Emergency Department immediately if worsening.

## 2018-02-03 NOTE — ED Provider Notes (Signed)
[facility] EMERGENCY DEPARTMENT Provider Note   CSN: [id number] Arrival date & time: 02/03/18  1005     History   Chief Complaint Chief Complaint  Patient presents with  . Tingling    HPI Lindsey Evans is a 67 y.o. female.  HPI  Pt was seen at 1105. Per pt, c/o gradual onset and persistence of multiple intermittent episodes of right hand "tingling" for the past several weeks. Has been associated with right neck "pain." Pt states the tingling in her right hand became constant yesterday approximately 0800/0900 while working on her iPad. Pt states the tingling lasted all day yesterday and was present before she went to bed. Pt states he woke up in the middle of the night with her right hand "weak," "numb" and "tingling." Pt states she had to "rub it to wake it up." Pt states the tingling continued after that until this morning when it went away. Pt is right handed. Denies injury, no right hand/wrist/arm pain, CP/SOB, no N/V/D, no abd pain, no fevers, no low back pain, no visual changes, no focal motor weakness, no tingling/numbness in other extremities, no ataxia, no slurred speech, no facial droop.     Past Medical History:  Diagnosis Date  . Diabetes mellitus without complication ([place])   . GERD (gastroesophageal reflux disease)   . Hypertension   . Neck pain   . Vaginal dryness 12/14/2012    Patient Active Problem List   Diagnosis Date Noted  . GERD (gastroesophageal reflux disease) 06/12/2015  . Dyspareunia 01/03/2014  . Benign essential hypertension 01/03/2014  . Hyperlipidemia 01/03/2014  . Vaginal dryness 12/14/2012    Past Surgical History:  Procedure Laterality Date  . ABDOMINAL HYSTERECTOMY    . CHOLECYSTECTOMY     Gallstones  . ESOPHAGOGASTRODUODENOSCOPY N/A 07/03/2015   Procedure: ESOPHAGOGASTRODUODENOSCOPY (EGD);  Surgeon: Rogene Houston, MD;  Location: [facility];  Service: Endoscopy;  Laterality: N/A;  2:40 - moved to 1:45 - Ann to notify     OB  History    Gravida  2   Para  2   Term      Preterm      AB      Living        SAB      TAB      Ectopic      Multiple      Live Births               Home Medications    Prior to Admission medications   Medication Sig Start Date End Date Taking? Authorizing Provider  ALPRAZolam Duanne Moron) 0.5 MG tablet Take 0.5 mg by mouth as needed for anxiety.  12/20/13   [provider]  amLODipine (NORVASC) 5 MG tablet Take 5 mg by mouth daily.    [provider]  aspirin 81 MG tablet Take 81 mg by mouth daily.    [provider]  dexlansoprazole (DEXILANT) 60 MG capsule Take 1 capsule (60 mg total) by mouth daily. Patient not taking: Reported on 12/13/2017 07/14/16   Rogene Houston, MD  furosemide (LASIX) 20 MG tablet  12/12/17   [provider]  hydrochlorothiazide (HYDRODIURIL) 25 MG tablet Take 25 mg by mouth daily.    [provider]  metFORMIN (GLUCOPHAGE) 500 MG tablet Take 500 mg by mouth 2 (two) times daily with a meal.     [provider]  metoprolol succinate (TOPROL-XL) 50 MG 24 hr tablet Take 50 mg by  mouth daily. Take with or immediately following a meal.    [provider]  rosuvastatin (CRESTOR) 20 MG tablet Take 1 tablet by mouth daily. 01/23/18   [provider]    Family History Family History  Problem Relation Age of Onset  . Hypertension Mother   . Cancer Father        prostate  . Cancer Paternal Aunt        breast  . Diabetes Paternal Uncle     Social History Social History   Tobacco Use  . Smoking status: Never Smoker  . Smokeless tobacco: Never Used  Substance Use Topics  . Alcohol use: No  . Drug use: No     Allergies   Lipitor [atorvastatin]   Review of Systems Review of Systems ROS: Statement: All systems negative except as marked or noted in the HPI; Constitutional: Negative for fever and chills. ; ; Eyes: Negative for eye pain, redness and discharge. ; ; ENMT:  Negative for ear pain, hoarseness, nasal congestion, sinus pressure and sore throat. ; ; Cardiovascular: Negative for chest pain, palpitations, diaphoresis, dyspnea and peripheral edema. ; ; Respiratory: Negative for cough, wheezing and stridor. ; ; Gastrointestinal: Negative for nausea, vomiting, diarrhea, abdominal pain, blood in stool, hematemesis, jaundice and rectal bleeding. ; ; Genitourinary: Negative for dysuria, flank pain and hematuria. ; ; Musculoskeletal: +neck pain. Negative for back pain. Negative for swelling and trauma.; ; Skin: Negative for pruritus, rash, abrasions, blisters, bruising and skin lesion.; ; Neuro: +paresthesias. Negative for headache, lightheadedness and neck stiffness. Negative for weakness, altered level of consciousness, altered mental status, involuntary movement, seizure and syncope.       Physical Exam Updated Vital Signs BP (!) 153/80 (BP Location: Left Arm)   Pulse (!) 56   Temp 97.7 F (36.5 C) (Oral)   Resp 16   SpO2 100%   Physical Exam 1110: Physical examination:  Nursing notes reviewed; Vital signs and O2 SAT reviewed;  Constitutional: Well developed, Well nourished, Well hydrated, In no acute distress; Head:  Normocephalic, atraumatic; Eyes: EOMI, PERRL, No scleral icterus; ENMT: Mouth and pharynx normal, Mucous membranes moist; Neck: Supple, Full range of motion, No lymphadenopathy. No palp thrill, no bruits.; Cardiovascular: Regular rate and rhythm, No gallop; Respiratory: Breath sounds clear & equal bilaterally, No wheezes.  Speaking full sentences with ease, Normal respiratory effort/excursion; Chest: Nontender, Movement normal; Abdomen: Soft, Nontender, Nondistended, Normal bowel sounds; Genitourinary: No CVA tenderness; Spine:  No midline CS, TS, LS tenderness. +TTP right hypertonic trapezius muscle, +TTP right cervical paraspinal muscles.;; Extremities: Peripheral pulses normal, No tenderness, No edema, No calf edema or asymmetry.  Right shoulder  w/FROM.  NT to palp entire joint, AC joint, clavicle NT, scapula NT, proximal humerus NT, biceps tendon NT over bicipital groove.  Motor strength at shoulder normal.  Sensation intact over deltoid region, distal NMS intact with right hand having intact and equal sensation and strength in the distribution of the median, radial, and ulnar nerve function compared to opposite side.  Strong radial pulse.  +FROM right elbow with intact motor strength biceps and triceps muscles to resistance. No rash..; Neuro: AA&Ox3, Major CN grossly intact. Speech clear.  No facial droop.  No nystagmus. Grips equal. Strength 5/5 equal bilat UE's and LE's.  DTR 2/4 equal bilat UE's and LE's.  No gross sensory deficits.  Normal cerebellar testing bilat UE's (finger-nose) and LE's (heel-shin)..; Skin: Color normal, Warm, Dry.   ED Treatments / Results  Labs (all labs  ordered are listed, but only abnormal results are displayed)   EKG None  Radiology   Procedures Procedures (including critical care time)  Medications Ordered in ED Medications - No data to display   Initial Impression / Assessment and Plan / ED Course  I have reviewed the triage vital signs and the nursing notes.  Pertinent labs & imaging results that were available during my care of the patient were reviewed by me and considered in my medical decision making (see chart for details).  MDM Reviewed: previous chart, nursing note and vitals Reviewed previous: labs Interpretation: labs and MRI   Results for orders placed or performed during the hospital encounter of 69/62/95  Basic metabolic panel  Result Value Ref Range   Sodium 139 135 - 145 mmol/L   Potassium 3.6 3.5 - 5.1 mmol/L   Chloride 103 98 - 111 mmol/L   CO2 28 22 - 32 mmol/L   Glucose, Bld 125 (H) 70 - 99 mg/dL   BUN 20 8 - 23 mg/dL   Creatinine, Ser 0.90 0.44 - 1.00 mg/dL   Calcium 9.6 8.9 - 10.3 mg/dL   GFR calc non Af Amer >60 >60 mL/min   GFR calc Af Amer >60 >60 mL/min     Anion gap 8 5 - 15  CBC with Differential  Result Value Ref Range   WBC 8.1 4.0 - 10.5 K/uL   RBC 4.94 3.87 - 5.11 MIL/uL   Hemoglobin 13.7 12.0 - 15.0 g/dL   HCT 42.0 36.0 - 46.0 %   MCV 85.0 78.0 - 100.0 fL   MCH 27.7 26.0 - 34.0 pg   MCHC 32.6 30.0 - 36.0 g/dL   RDW 14.8 11.5 - 15.5 %   Platelets 314 150 - 400 K/uL   Neutrophils Relative % 45 %   Neutro Abs 3.7 1.7 - 7.7 K/uL   Lymphocytes Relative 42 %   Lymphs Abs 3.4 0.7 - 4.0 K/uL   Monocytes Relative 7 %   Monocytes Absolute 0.5 0.1 - 1.0 K/uL   Eosinophils Relative 5 %   Eosinophils Absolute 0.4 0.0 - 0.7 K/uL   Basophils Relative 1 %   Basophils Absolute 0.0 0.0 - 0.1 K/uL   Mr Brain Wo Contrast (neuro Protocol) Result Date: 02/03/2018 CLINICAL DATA:  67 year old female with right neck pain. Right arm numbness and tingling for several weeks. Right hand tingling since yesterday. EXAM: MRI HEAD WITHOUT CONTRAST TECHNIQUE: Multiplanar, multiecho pulse sequences of the brain and surrounding structures were obtained without intravenous contrast. COMPARISON:  Cervical spine MRI today reported separately. Brain MRI 01/17/2014. FINDINGS: Brain: No restricted diffusion to suggest acute infarction. No midline shift, mass effect, evidence of mass lesion, ventriculomegaly, extra-axial collection or acute intracranial hemorrhage. Cervicomedullary junction and pituitary are within normal limits. Stable cerebral volume since 2015. Pearline Cables and white matter signal appears stable and normal for age. Asymmetric dural calcifications along the right superior convexity redemonstrated. No cortical encephalomalacia or chronic cerebral blood products. Normal deep gray matter nuclei, brainstem, and cerebellum. Vascular: Major intracranial vascular flow voids are stable since 2015. Skull and upper cervical spine: Cervical spine findings today are reported separately. Visualized bone marrow signal is within normal limits. Sinuses/Orbits: Normal orbits soft  tissues. Trace paranasal sinus mucosal thickening has not significantly changed since 2015. Other: Mastoid air cells remain clear. Visible internal auditory structures appear normal. Scalp and face soft tissues appear negative. IMPRESSION: Stable since 2015 and negative for age noncontrast MRI appearance of the brain. Electronically Signed  By: Genevie Ann M.D.   On: 02/03/2018 12:52   Mr Cervical Spine Wo Contrast Result Date: 02/03/2018 CLINICAL DATA:  67 year old female with right neck pain. Right arm numbness and tingling for several weeks. Right hand tingling since yesterday. EXAM: MRI CERVICAL SPINE WITHOUT CONTRAST TECHNIQUE: Multiplanar, multisequence MR imaging of the cervical spine was performed. No intravenous contrast was administered. COMPARISON:  Brain MRI today reported separately. Brain MRI 01/17/2014. FINDINGS: Alignment: Mild straightening of cervical lordosis. No significant spondylolisthesis. Vertebrae: Mild degenerative endplate marrow edema inferiorly at C3 (see C3-C4 findings below). Background bone marrow signal is normal. Chronic degenerative endplate marrow signal changes elsewhere. No other acute osseous abnormality identified. Cord: No abnormal cord signal despite cord compression at the C3-C4 level. Cervical spinal cord morphology elsewhere is within normal limits. Posterior Fossa, vertebral arteries, paraspinal tissues: Brain MRI findings today are reported separately. Cervicomedullary junction is within normal limits. Preserved major vascular flow voids in the neck. Negative neck soft tissues. Disc levels: C2-C3:  Negative. C3-C4: Disc space loss with circumferential disc bulge and moderate to large right paracentral mostly cephalad disc extrusion (series 3, image 6 and series 7, image 15). Spinal stenosis with mild to moderate mass effect on the right hemi cord. No definite cord mass effect. Superimposed mild facet hypertrophy. Minimal to mild bilateral C4 foraminal stenosis. Probable  small right side nerve root diverticulum. C4-C5: Disc space loss with right eccentric circumferential disc osteophyte complex. Mild spinal stenosis with borderline to mild right hemi cord mass effect. Moderate to severe right and borderline to mild left C5 neural foraminal stenosis. C5-C6: Disc space loss with circumferential disc osteophyte complex. Broad-based posterior component. Borderline to mild spinal stenosis. Left greater than right foraminal involvement. Moderate left C6 foraminal stenosis. C6-C7: Mild circumferential disc bulge. No stenosis. Small bilateral nerve root diverticula (normal variant). C7-T1: Mild to moderate bilateral facet hypertrophy. Mild uncovertebral spurring. No spinal stenosis. Borderline to mild bilateral C8 foraminal stenosis with small bilateral nerve root diverticula (normal variant). T1-T2: Disc bulging. Mild to moderate facet hypertrophy. Mild bilateral T1 foraminal stenosis. IMPRESSION: 1. Advanced C3-C4 disc and endplate degeneration with bulky rightward disc herniation. Spinal stenosis with mild to moderate mass effect on the right hemi cord, but no cord signal abnormality identified. 2. Up to mild degenerative spinal stenosis also at C4-C5 and C5-C6. Associated moderate to severe right C5 and moderate left C6 neural foraminal stenosis. Electronically Signed   By: Genevie Ann M.D.   On: 02/03/2018 12:42     1420:  Neuro exam continues intact/unchanged. Pt denies any tingling/numbness in right hand. MRI as above. Doubt carotid dissection with normal flow voids on MRI and no bruit or thrill on neck exam. D/W pt regarding same; verb understanding. Pt states she will find a ride home, and is requesting pain meds while in the ED.  [facility] Zada Finders, case discussed, including:  HPI, pertinent PM/SHx, VS/PE, dx testing, ED course and treatment:  He has viewed the MRI images, no acute surgical issue at this time, no further chriropractics, f/u in office.  Dx and testing d/w pt.  Questions answered.  Verb understanding, agreeable to d/c home with outpt f/u.       Final Clinical Impressions(s) / ED Diagnoses   Final diagnoses:  None    ED Discharge Orders    None       Francine Graven, DO 02/05/18 1544

## 2018-02-06 DIAGNOSIS — E663 Overweight: Secondary | ICD-10-CM | POA: Diagnosis not present

## 2018-02-06 DIAGNOSIS — E063 Autoimmune thyroiditis: Secondary | ICD-10-CM | POA: Diagnosis not present

## 2018-02-06 DIAGNOSIS — Z1389 Encounter for screening for other disorder: Secondary | ICD-10-CM | POA: Diagnosis not present

## 2018-02-06 DIAGNOSIS — I10 Essential (primary) hypertension: Secondary | ICD-10-CM | POA: Diagnosis not present

## 2018-02-06 DIAGNOSIS — E119 Type 2 diabetes mellitus without complications: Secondary | ICD-10-CM | POA: Diagnosis not present

## 2018-02-06 DIAGNOSIS — G459 Transient cerebral ischemic attack, unspecified: Secondary | ICD-10-CM | POA: Diagnosis not present

## 2018-02-06 DIAGNOSIS — Z6827 Body mass index (BMI) 27.0-27.9, adult: Secondary | ICD-10-CM | POA: Diagnosis not present

## 2018-02-07 ENCOUNTER — Other Ambulatory Visit (HOSPITAL_COMMUNITY): Payer: Self-pay | Admitting: Internal Medicine

## 2018-02-07 DIAGNOSIS — G459 Transient cerebral ischemic attack, unspecified: Secondary | ICD-10-CM

## 2018-02-10 ENCOUNTER — Ambulatory Visit (HOSPITAL_COMMUNITY)
Admission: RE | Admit: 2018-02-10 | Discharge: 2018-02-10 | Disposition: A | Discharge status: Home / Self Care | Payer: Medicare Other | Source: Ambulatory Visit | Attending: Internal Medicine | Admitting: Internal Medicine

## 2018-02-10 DIAGNOSIS — I6523 Occlusion and stenosis of bilateral carotid arteries: Secondary | ICD-10-CM | POA: Diagnosis not present

## 2018-02-10 DIAGNOSIS — G459 Transient cerebral ischemic attack, unspecified: Secondary | ICD-10-CM | POA: Diagnosis present

## 2018-02-21 ENCOUNTER — Other Ambulatory Visit (INDEPENDENT_AMBULATORY_CARE_PROVIDER_SITE_OTHER): Payer: Self-pay | Admitting: Internal Medicine

## 2018-03-02 DIAGNOSIS — D510 Vitamin B12 deficiency anemia due to intrinsic factor deficiency: Secondary | ICD-10-CM | POA: Diagnosis not present

## 2018-03-15 DIAGNOSIS — Z6828 Body mass index (BMI) 28.0-28.9, adult: Secondary | ICD-10-CM | POA: Diagnosis not present

## 2018-03-15 DIAGNOSIS — J042 Acute laryngotracheitis: Secondary | ICD-10-CM | POA: Diagnosis not present

## 2018-03-15 DIAGNOSIS — J329 Chronic sinusitis, unspecified: Secondary | ICD-10-CM | POA: Diagnosis not present

## 2018-03-15 DIAGNOSIS — K219 Gastro-esophageal reflux disease without esophagitis: Secondary | ICD-10-CM | POA: Diagnosis not present

## 2018-03-15 DIAGNOSIS — Z6827 Body mass index (BMI) 27.0-27.9, adult: Secondary | ICD-10-CM | POA: Diagnosis not present

## 2018-03-15 DIAGNOSIS — M5412 Radiculopathy, cervical region: Secondary | ICD-10-CM | POA: Diagnosis not present

## 2018-03-15 DIAGNOSIS — E119 Type 2 diabetes mellitus without complications: Secondary | ICD-10-CM | POA: Diagnosis not present

## 2018-03-15 DIAGNOSIS — I10 Essential (primary) hypertension: Secondary | ICD-10-CM | POA: Diagnosis not present

## 2018-03-29 DIAGNOSIS — Z23 Encounter for immunization: Secondary | ICD-10-CM | POA: Diagnosis not present

## 2018-04-04 DIAGNOSIS — E118 Type 2 diabetes mellitus with unspecified complications: Secondary | ICD-10-CM | POA: Diagnosis not present

## 2018-04-04 DIAGNOSIS — M5002 Cervical disc disorder with myelopathy, mid-cervical region, unspecified level: Secondary | ICD-10-CM | POA: Diagnosis not present

## 2018-04-04 DIAGNOSIS — I10 Essential (primary) hypertension: Secondary | ICD-10-CM | POA: Diagnosis not present

## 2018-04-04 DIAGNOSIS — M5012 Mid-cervical disc disorder, unspecified level: Secondary | ICD-10-CM | POA: Diagnosis not present

## 2018-04-21 DIAGNOSIS — E538 Deficiency of other specified B group vitamins: Secondary | ICD-10-CM | POA: Diagnosis not present

## 2018-06-07 DIAGNOSIS — E119 Type 2 diabetes mellitus without complications: Secondary | ICD-10-CM | POA: Diagnosis not present

## 2018-06-07 DIAGNOSIS — Z1389 Encounter for screening for other disorder: Secondary | ICD-10-CM | POA: Diagnosis not present

## 2018-06-07 DIAGNOSIS — E538 Deficiency of other specified B group vitamins: Secondary | ICD-10-CM | POA: Diagnosis not present

## 2018-06-07 DIAGNOSIS — K219 Gastro-esophageal reflux disease without esophagitis: Secondary | ICD-10-CM | POA: Diagnosis not present

## 2018-06-07 DIAGNOSIS — E663 Overweight: Secondary | ICD-10-CM | POA: Diagnosis not present

## 2018-06-07 DIAGNOSIS — Z0001 Encounter for general adult medical examination with abnormal findings: Secondary | ICD-10-CM | POA: Diagnosis not present

## 2018-06-07 DIAGNOSIS — N952 Postmenopausal atrophic vaginitis: Secondary | ICD-10-CM | POA: Diagnosis not present

## 2018-06-07 DIAGNOSIS — I10 Essential (primary) hypertension: Secondary | ICD-10-CM | POA: Diagnosis not present

## 2018-06-07 DIAGNOSIS — F419 Anxiety disorder, unspecified: Secondary | ICD-10-CM | POA: Diagnosis not present

## 2018-06-07 DIAGNOSIS — Z6828 Body mass index (BMI) 28.0-28.9, adult: Secondary | ICD-10-CM | POA: Diagnosis not present

## 2018-06-07 DIAGNOSIS — J309 Allergic rhinitis, unspecified: Secondary | ICD-10-CM | POA: Diagnosis not present

## 2018-07-20 DIAGNOSIS — E538 Deficiency of other specified B group vitamins: Secondary | ICD-10-CM | POA: Diagnosis not present

## 2018-08-21 ENCOUNTER — Ambulatory Visit (INDEPENDENT_AMBULATORY_CARE_PROVIDER_SITE_OTHER): Payer: Medicare Other | Admitting: Orthopedic Surgery

## 2018-08-21 ENCOUNTER — Encounter: Payer: Self-pay | Admitting: Orthopedic Surgery

## 2018-08-21 VITALS — BP 138/90 | HR 62 | Ht 62.0 in | Wt 150.0 lb

## 2018-08-21 DIAGNOSIS — M65331 Trigger finger, right middle finger: Secondary | ICD-10-CM | POA: Insufficient documentation

## 2018-08-21 NOTE — Progress Notes (Signed)
Progress Note   Patient ID: Lindsey Evans, female   DOB: [birth year], 68 y.o.   MRN: [id number]   Chief Complaint  Patient presents with  . Hand Pain    right middle finger painful,  not catching     68 years old had a trigger finger injection back in 2018 comes in with 4 months of pain worse over the last month intermittent locking trouble picking up things and holding onto things because of palmar pain  Previous office visit was 2018  chief complaint Patient presents with . Hand Pain     right      HPI Lindsey Evans is a 68 y.o. female.  Presents for evaluation of right long finger   She is 68 years old she is had 3 to 81-month history of pain over the A1 pulley catching and locking right long/middle finger no prior treatment no trauma catching occurs primarily early in the morning and then after activity pain is mild to moderate dull and achyTrigger finger injection   Diagnosis tenosynovitis right middle/long finger Procedure injection A1 pulley Medications lidocaine 1% 1 mL and Depo-Medrol 40 mg 1 mL Skin prep alcohol and ethyl chloride Verbal consent was obtained Timeout confirmed the injection site   Review of Systems  Neurological: Negative for tingling and sensory change.   Current Meds  Medication Sig  . ALPRAZolam (XANAX) 0.5 MG tablet Take 0.5 mg by mouth as needed for anxiety.   Marland Kitchen amLODipine (NORVASC) 5 MG tablet Take 5 mg by mouth daily.  Marland Kitchen aspirin 81 MG tablet Take 81 mg by mouth daily.  Marland Kitchen DEXILANT 60 MG capsule TAKE (1) CAPSULE BY MOUTH ONCE DAILY.  . furosemide (LASIX) 20 MG tablet Take 20 mg by mouth daily.   . hydrochlorothiazide (HYDRODIURIL) 25 MG tablet Take 25 mg by mouth daily.  Marland Kitchen HYDROcodone-acetaminophen (NORCO/VICODIN) 5-325 MG tablet 1 or 2 tabs PO q8 hours prn pain  . methocarbamol (ROBAXIN-750) 750 MG tablet Take 1 tablet (750 mg total) by mouth every 8 (eight) hours as needed for muscle spasms.  . metoprolol succinate (TOPROL-XL) 50 MG 24  hr tablet Take 50 mg by mouth daily. Take with or immediately following a meal.    Past Medical History:  Diagnosis Date  . Diabetes mellitus without complication ([place])   . GERD (gastroesophageal reflux disease)   . Hypertension   . Neck pain   . Vaginal dryness 12/14/2012     Allergies  Allergen Reactions  . Lipitor [Atorvastatin] Other (See Comments)    Leg cramps     BP 138/90   Pulse 62   Ht 5\' 2"  (1.575 m)   Wt 150 lb (68 kg)   BMI 27.44 kg/m    Physical Exam General appearance normal Oriented x3 normal Mood pleasant affect normal  not applicable  Ortho Exam Right hand  Inspection tenderness over the A1 pulley of the right long finger  Full range of motion without locking No instability flexor tendon strength is normal skin is warm dry and intact color capillary refill temperature of the digits are normal and there are no sensory abnormalities     MEDICAL DECISION MAKING   Imaging:  None today  No diagnosis found.   PLAN: (RX., injection, surgery,frx,mri/ct, XR 2 body ares) I gave the patient an option of surgery versus injection she chose to get injection  Trigger finger injection  Diagnosis no synovitis right long finger  procedure injection A1 pulley Medications lidocaine 1% 1 mL  and Depo-Medrol 40 mg 1 mL Skin prep alcohol and ethyl chloride Verbal consent was obtained Timeout confirmed the injection site  After cleaning the skin with alcohol and anesthetizing the skin with ethyl chloride the A1 pulley was palpated and the injection was performed without complication   Fu as needed No orders of the defined types were placed in this encounter.  10:46 AM 08/21/2018

## 2018-09-18 DIAGNOSIS — D510 Vitamin B12 deficiency anemia due to intrinsic factor deficiency: Secondary | ICD-10-CM | POA: Diagnosis not present

## 2018-12-29 ENCOUNTER — Other Ambulatory Visit: Payer: Self-pay | Admitting: Family Medicine

## 2018-12-29 DIAGNOSIS — Z1231 Encounter for screening mammogram for malignant neoplasm of breast: Secondary | ICD-10-CM

## 2019-01-08 DIAGNOSIS — M5012 Mid-cervical disc disorder, unspecified level: Secondary | ICD-10-CM | POA: Diagnosis not present

## 2019-01-08 DIAGNOSIS — I10 Essential (primary) hypertension: Secondary | ICD-10-CM | POA: Diagnosis not present

## 2019-01-08 DIAGNOSIS — E118 Type 2 diabetes mellitus with unspecified complications: Secondary | ICD-10-CM | POA: Diagnosis not present

## 2019-01-08 DIAGNOSIS — M5002 Cervical disc disorder with myelopathy, mid-cervical region, unspecified level: Secondary | ICD-10-CM | POA: Diagnosis not present

## 2019-01-09 DIAGNOSIS — E538 Deficiency of other specified B group vitamins: Secondary | ICD-10-CM | POA: Diagnosis not present

## 2019-01-09 DIAGNOSIS — K219 Gastro-esophageal reflux disease without esophagitis: Secondary | ICD-10-CM | POA: Diagnosis not present

## 2019-01-09 DIAGNOSIS — G5601 Carpal tunnel syndrome, right upper limb: Secondary | ICD-10-CM | POA: Diagnosis not present

## 2019-01-09 DIAGNOSIS — I10 Essential (primary) hypertension: Secondary | ICD-10-CM | POA: Diagnosis not present

## 2019-01-09 DIAGNOSIS — E663 Overweight: Secondary | ICD-10-CM | POA: Diagnosis not present

## 2019-01-09 DIAGNOSIS — Z1389 Encounter for screening for other disorder: Secondary | ICD-10-CM | POA: Diagnosis not present

## 2019-01-09 DIAGNOSIS — E119 Type 2 diabetes mellitus without complications: Secondary | ICD-10-CM | POA: Diagnosis not present

## 2019-01-09 DIAGNOSIS — Z6828 Body mass index (BMI) 28.0-28.9, adult: Secondary | ICD-10-CM | POA: Diagnosis not present

## 2019-02-21 DIAGNOSIS — E538 Deficiency of other specified B group vitamins: Secondary | ICD-10-CM | POA: Diagnosis not present

## 2019-02-21 DIAGNOSIS — Z23 Encounter for immunization: Secondary | ICD-10-CM | POA: Diagnosis not present

## 2019-06-05 ENCOUNTER — Telehealth: Payer: Self-pay | Admitting: Orthopedic Surgery

## 2019-06-05 NOTE — Telephone Encounter (Signed)
Patient stopped into office to request one of our appointment cards in event she needs to schedule appointment for her finger, which she said is not hurting now. I offered to schedule; states she just wanted a card for now.

## 2019-06-19 DIAGNOSIS — Z0000 Encounter for general adult medical examination without abnormal findings: Secondary | ICD-10-CM | POA: Diagnosis not present

## 2019-06-19 DIAGNOSIS — E7849 Other hyperlipidemia: Secondary | ICD-10-CM | POA: Diagnosis not present

## 2019-06-19 DIAGNOSIS — F419 Anxiety disorder, unspecified: Secondary | ICD-10-CM | POA: Diagnosis not present

## 2019-06-19 DIAGNOSIS — E1165 Type 2 diabetes mellitus with hyperglycemia: Secondary | ICD-10-CM | POA: Diagnosis not present

## 2019-06-19 DIAGNOSIS — Z6828 Body mass index (BMI) 28.0-28.9, adult: Secondary | ICD-10-CM | POA: Diagnosis not present

## 2019-06-19 DIAGNOSIS — I10 Essential (primary) hypertension: Secondary | ICD-10-CM | POA: Diagnosis not present

## 2019-07-11 ENCOUNTER — Other Ambulatory Visit: Payer: Self-pay

## 2019-07-11 ENCOUNTER — Encounter: Payer: Self-pay | Admitting: Orthopedic Surgery

## 2019-07-11 ENCOUNTER — Ambulatory Visit (INDEPENDENT_AMBULATORY_CARE_PROVIDER_SITE_OTHER): Payer: Medicare Other | Admitting: Orthopedic Surgery

## 2019-07-11 VITALS — BP 123/76 | HR 60 | Ht 62.0 in | Wt 155.0 lb

## 2019-07-11 DIAGNOSIS — M65331 Trigger finger, right middle finger: Secondary | ICD-10-CM | POA: Diagnosis not present

## 2019-07-11 NOTE — Patient Instructions (Signed)

## 2019-07-11 NOTE — Progress Notes (Signed)
Chief Complaint  Patient presents with  . Hand Problem    Right long finger, has questions a bout surgery right middle finger painful and stiff soaking using bengay     69 year old female previously received 2 injections in the right long finger for triggering comes in today with consistent pain over the A1 pulley  Symptoms started back in 2018  She wanted know about surgery  We told her with outpatient surgery takes about 15 minutes has minimal risks  However after evaluating her she is not having any locking but a lot of soreness and trouble picking things up  Exam of the hand shows tenderness over the A1 pulley no catching or locking decreased flexion no of the long finger neurovascular exam is intact color looks good flexor tendon strength is normal  We decided instead to do an injection today she will consider surgery at a later time  Trigger finger injection  Diagnosis tenosynovitis of the right long finger Procedure injection A1 pulley Medications lidocaine 1% 1 mL and Depo-Medrol 40 mg 1 mL Skin prep alcohol and ethyl chloride Verbal consent was obtained Timeout confirmed the injection site  After cleaning the skin with alcohol and anesthetizing the skin with ethyl chloride the A1 pulley was palpated and the injection was performed without complication  Chronic problem with exacerbation plus injection

## 2019-09-12 DIAGNOSIS — Z23 Encounter for immunization: Secondary | ICD-10-CM | POA: Diagnosis not present

## 2019-10-15 ENCOUNTER — Ambulatory Visit
Admission: EM | Admit: 2019-10-15 | Discharge: 2019-10-15 | Disposition: A | Discharge status: Home / Self Care | Payer: PRIVATE HEALTH INSURANCE | Attending: Emergency Medicine | Admitting: Emergency Medicine

## 2019-10-15 ENCOUNTER — Other Ambulatory Visit: Payer: Self-pay

## 2019-10-15 DIAGNOSIS — M25511 Pain in right shoulder: Secondary | ICD-10-CM

## 2019-10-15 DIAGNOSIS — M7521 Bicipital tendinitis, right shoulder: Secondary | ICD-10-CM

## 2019-10-15 DIAGNOSIS — S4991XA Unspecified injury of right shoulder and upper arm, initial encounter: Secondary | ICD-10-CM | POA: Diagnosis not present

## 2019-10-15 MED ORDER — PREDNISONE 10 MG (21) PO TBPK: ORAL_TABLET | Freq: Every day | ORAL | 0 refills | Status: DC

## 2019-10-15 NOTE — ED Triage Notes (Signed)
Pt states she had a fall in December and has had pain in right  Shoulder intermittently since then

## 2019-10-15 NOTE — Discharge Instructions (Signed)
Continue conservative management of rest, ice, and gentle stretches Prednisone prescribed.  Take as directed and to completion Sling given.  Wear while doing daily activities such as cleaning, and cooking, to avoid overuse and to give your arm time to rest and heal Follow up with orthopedist if symptoms persist Return or go to the ER if you have any new or worsening symptoms (fever, chills, redness, swelling, deformity, bruising, worsening symptoms despite treatment, etc...)

## 2019-10-15 NOTE — ED Provider Notes (Signed)
[place]   [id number] 10/15/19 Arrival Time: 77  CC: RT shoulder PAIN  SUBJECTIVE: History from: patient. RHILYN Evans is a 69 y.o. female complains of improving, intermittent RT shoulder pain x 4 months.  Had fall in December.  Fall on bent RT arm.  Localizes the pain to the bicep.  Describes the pain as intermittent and achy/ throbbing in character.  Has tried OTC medications without relief.  Symptoms are made worse with ROM.  Denies similar symptoms in the past.  Denies fever, chills, erythema, ecchymosis, effusion, weakness, numbness and tingling.  ROS: As per HPI.  All other pertinent ROS negative.     Past Medical History:  Diagnosis Date   Diabetes mellitus without complication (HCC)    GERD (gastroesophageal reflux disease)    Hypertension    Neck pain    Vaginal dryness 12/14/2012   Past Surgical History:  Procedure Laterality Date   ABDOMINAL HYSTERECTOMY     CHOLECYSTECTOMY     Gallstones   ESOPHAGOGASTRODUODENOSCOPY N/A 07/03/2015   Procedure: ESOPHAGOGASTRODUODENOSCOPY (EGD);  Surgeon: Rogene Houston, MD;  [facility];  Service: Endoscopy;  Laterality: N/A;  2:40 - moved to 1:45 - Ann to notify   Allergies  Allergen Reactions   Lipitor [Atorvastatin] Other (See Comments)    Leg cramps   No current facility-administered medications on file prior to encounter.   Current Outpatient Medications on File Prior to Encounter  Medication Sig Dispense Refill   ALPRAZolam (XANAX) 0.5 MG tablet Take 0.5 mg by mouth as needed for anxiety.      amLODipine (NORVASC) 5 MG tablet Take 5 mg by mouth daily.     aspirin 81 MG tablet Take 81 mg by mouth daily.     DEXILANT 60 MG capsule TAKE (1) CAPSULE BY MOUTH ONCE DAILY. 30 capsule 4   furosemide (LASIX) 20 MG tablet Take 20 mg by mouth daily.      hydrochlorothiazide (HYDRODIURIL) 25 MG tablet Take 25 mg by mouth daily.     metoprolol succinate (TOPROL-XL) 50 MG 24 hr tablet Take  50 mg by mouth daily. Take with or immediately following a meal.     rosuvastatin (CRESTOR) 20 MG tablet Take 1 tablet by mouth daily.     Social History   Socioeconomic History   Marital status: Married    Spouse name: Not on file   Number of children: Not on file   Years of education: Not on file   Highest education level: Not on file  Occupational History   Not on file  Tobacco Use   Smoking status: Never Smoker   Smokeless tobacco: Never Used  Substance and Sexual Activity   Alcohol use: No   Drug use: No   Sexual activity: Not Currently    Birth control/protection: Surgical  Other Topics Concern   Not on file  Social History Narrative   Not on file   Social Determinants of Health   Financial Resource Strain:    Difficulty of Paying Living Expenses:   Food Insecurity:    Worried About Charity fundraiser in the Last Year:    Arboriculturist in the Last Year:   Transportation Needs:    Film/video editor (Medical):    Lack of Transportation (Non-Medical):   Physical Activity:    Days of Exercise per Week:    Minutes of Exercise per Session:   Stress:    Feeling of Stress :   Social  Connections:    Frequency of Communication with Friends and Family:    Frequency of Social Gatherings with Friends and Family:    Attends Religious Services:    Active Member of Clubs or Organizations:    Attends Music therapist:    Marital Status:   Intimate Partner Violence:    Fear of Current or Ex-Partner:    Emotionally Abused:    Physically Abused:    Sexually Abused:    Family History  Problem Relation Age of Onset   Hypertension Mother    Cancer Father        prostate   Cancer Paternal Aunt        breast   Diabetes Paternal Uncle     OBJECTIVE:  Vitals:   10/15/19 1433  BP: 128/84  Pulse: 60  Resp: 18  Temp: 98.3 F (36.8 C)  SpO2: 97%    General appearance: ALERT; in no acute distress.  Head:  NCAT Lungs: Normal respiratory effort CV: Radial pulses 2+ . Cap refill < 2 seconds Musculoskeletal: RT shoulder  Inspection: Skin warm, dry, clear and intact without obvious erythema, effusion, or ecchymosis.  Palpation: TTP over distal upper arm including medial and lateral aspects ROM: FROM active and passive Strength: 5/5 shld abduction, 5/5 shld adduction, 5/5 elbow flexion, 5/5 elbow extension, 5/5 grip strength Skin: warm and dry Neurologic: Ambulates without difficulty; Sensation intact about the upper extremities Psychological: alert and cooperative; normal mood and affect  ASSESSMENT & PLAN:  1. Injury of right shoulder, initial encounter   2. Acute pain of right shoulder   3. Biceps tendinitis on right    Meds ordered this encounter  Medications   predniSONE (STERAPRED UNI-PAK 21 TAB) 10 MG (21) TBPK tablet    Sig: Take by mouth daily. Take 6 tabs by mouth daily  for 2 days, then 5 tabs for 2 days, then 4 tabs for 2 days, then 3 tabs for 2 days, 2 tabs for 2 days, then 1 tab by mouth daily for 2 days    Dispense:  42 tablet    Refill:  0    Order Specific Question:   Supervising Provider    Answer:   Raylene Everts [id number]    Continue conservative management of rest, ice, and gentle stretches Prednisone prescribed.  Take as directed and to completion Sling given.  Wear while doing daily activities such as cleaning, and cooking, to avoid overuse and to give your arm time to rest and heal Follow up with orthopedist if symptoms persist Return or go to the ER if you have any new or worsening symptoms (fever, chills, redness, swelling, deformity, bruising, worsening symptoms despite treatment, etc...)   Reviewed expectations re: course of current medical issues. Questions answered. Outlined signs and symptoms indicating need for more acute intervention. Patient verbalized understanding. After Visit Summary given.    Lestine Box, PA-C 10/15/19 1448

## 2019-11-30 DIAGNOSIS — H10013 Acute follicular conjunctivitis, bilateral: Secondary | ICD-10-CM | POA: Diagnosis not present

## 2019-11-30 DIAGNOSIS — H40033 Anatomical narrow angle, bilateral: Secondary | ICD-10-CM | POA: Diagnosis not present

## 2019-12-06 DIAGNOSIS — Z6828 Body mass index (BMI) 28.0-28.9, adult: Secondary | ICD-10-CM | POA: Diagnosis not present

## 2019-12-06 DIAGNOSIS — E663 Overweight: Secondary | ICD-10-CM | POA: Diagnosis not present

## 2019-12-06 DIAGNOSIS — E7849 Other hyperlipidemia: Secondary | ICD-10-CM | POA: Diagnosis not present

## 2020-01-22 DIAGNOSIS — K219 Gastro-esophageal reflux disease without esophagitis: Secondary | ICD-10-CM | POA: Diagnosis not present

## 2020-01-22 DIAGNOSIS — E663 Overweight: Secondary | ICD-10-CM | POA: Diagnosis not present

## 2020-01-22 DIAGNOSIS — E119 Type 2 diabetes mellitus without complications: Secondary | ICD-10-CM | POA: Diagnosis not present

## 2020-01-22 DIAGNOSIS — I10 Essential (primary) hypertension: Secondary | ICD-10-CM | POA: Diagnosis not present

## 2020-01-22 DIAGNOSIS — Z6827 Body mass index (BMI) 27.0-27.9, adult: Secondary | ICD-10-CM | POA: Diagnosis not present

## 2020-04-08 DIAGNOSIS — Z23 Encounter for immunization: Secondary | ICD-10-CM | POA: Diagnosis not present

## 2020-05-21 ENCOUNTER — Other Ambulatory Visit: Payer: Self-pay

## 2020-05-21 ENCOUNTER — Encounter: Payer: Self-pay | Admitting: Orthopedic Surgery

## 2020-05-21 ENCOUNTER — Ambulatory Visit (INDEPENDENT_AMBULATORY_CARE_PROVIDER_SITE_OTHER): Payer: Medicare Other | Admitting: Orthopedic Surgery

## 2020-05-21 VITALS — BP 131/81 | HR 66 | Ht 62.0 in | Wt 155.0 lb

## 2020-05-21 DIAGNOSIS — M65331 Trigger finger, right middle finger: Secondary | ICD-10-CM

## 2020-05-21 NOTE — Progress Notes (Signed)
Chief Complaint  Patient presents with  . Hand Problem    right middle finger    RIGHT LONG FINGER pain and popping   3 prior injections   Trigger finger injection  Diagnosis   Encounter Diagnosis  Name Primary?  . Trigger finger, right middle finger Yes    Procedure injection A1 pulley Medications lidocaine 1% 1 mL and Depo-Medrol 40 mg 1 mL Skin prep alcohol and ethyl chloride Verbal consent was obtained Timeout confirmed the injection site  After cleaning the skin with alcohol and anesthetizing the skin with ethyl chloride the A1 pulley was palpated and the injection was performed without complication  F/u prn

## 2020-05-21 NOTE — Patient Instructions (Signed)

## 2020-05-30 DIAGNOSIS — Z23 Encounter for immunization: Secondary | ICD-10-CM | POA: Diagnosis not present

## 2020-06-19 DIAGNOSIS — E039 Hypothyroidism, unspecified: Secondary | ICD-10-CM | POA: Diagnosis not present

## 2020-06-19 DIAGNOSIS — E119 Type 2 diabetes mellitus without complications: Secondary | ICD-10-CM | POA: Diagnosis not present

## 2020-06-19 DIAGNOSIS — K219 Gastro-esophageal reflux disease without esophagitis: Secondary | ICD-10-CM | POA: Diagnosis not present

## 2020-06-19 DIAGNOSIS — Z79899 Other long term (current) drug therapy: Secondary | ICD-10-CM | POA: Diagnosis not present

## 2020-06-19 DIAGNOSIS — Z6828 Body mass index (BMI) 28.0-28.9, adult: Secondary | ICD-10-CM | POA: Diagnosis not present

## 2020-06-19 DIAGNOSIS — I10 Essential (primary) hypertension: Secondary | ICD-10-CM | POA: Diagnosis not present

## 2020-06-19 DIAGNOSIS — Z1331 Encounter for screening for depression: Secondary | ICD-10-CM | POA: Diagnosis not present

## 2020-06-19 DIAGNOSIS — H6122 Impacted cerumen, left ear: Secondary | ICD-10-CM | POA: Diagnosis not present

## 2020-06-19 DIAGNOSIS — Z0001 Encounter for general adult medical examination with abnormal findings: Secondary | ICD-10-CM | POA: Diagnosis not present

## 2020-07-23 DIAGNOSIS — M5030 Other cervical disc degeneration, unspecified cervical region: Secondary | ICD-10-CM | POA: Diagnosis not present

## 2020-07-23 DIAGNOSIS — M542 Cervicalgia: Secondary | ICD-10-CM | POA: Diagnosis not present

## 2020-07-23 DIAGNOSIS — M5010 Cervical disc disorder with radiculopathy, unspecified cervical region: Secondary | ICD-10-CM | POA: Diagnosis not present

## 2020-07-23 DIAGNOSIS — G4486 Cervicogenic headache: Secondary | ICD-10-CM | POA: Diagnosis not present

## 2020-09-02 ENCOUNTER — Other Ambulatory Visit (HOSPITAL_COMMUNITY): Payer: Self-pay | Admitting: Internal Medicine

## 2020-09-02 ENCOUNTER — Other Ambulatory Visit: Payer: Self-pay | Admitting: Internal Medicine

## 2020-09-02 DIAGNOSIS — Z1389 Encounter for screening for other disorder: Secondary | ICD-10-CM | POA: Diagnosis not present

## 2020-09-02 DIAGNOSIS — K219 Gastro-esophageal reflux disease without esophagitis: Secondary | ICD-10-CM | POA: Diagnosis not present

## 2020-09-02 DIAGNOSIS — Z6828 Body mass index (BMI) 28.0-28.9, adult: Secondary | ICD-10-CM | POA: Diagnosis not present

## 2020-09-02 DIAGNOSIS — I10 Essential (primary) hypertension: Secondary | ICD-10-CM | POA: Diagnosis not present

## 2020-09-02 DIAGNOSIS — L209 Atopic dermatitis, unspecified: Secondary | ICD-10-CM | POA: Diagnosis not present

## 2020-09-02 DIAGNOSIS — E663 Overweight: Secondary | ICD-10-CM | POA: Diagnosis not present

## 2020-09-02 DIAGNOSIS — R109 Unspecified abdominal pain: Secondary | ICD-10-CM

## 2020-09-29 ENCOUNTER — Ambulatory Visit (HOSPITAL_COMMUNITY)
Admission: RE | Admit: 2020-09-29 | Discharge: 2020-09-29 | Disposition: A | Discharge status: Home / Self Care | Payer: Medicare Other | Source: Ambulatory Visit | Attending: Internal Medicine | Admitting: Internal Medicine

## 2020-09-29 ENCOUNTER — Other Ambulatory Visit: Payer: Self-pay

## 2020-09-29 DIAGNOSIS — R1031 Right lower quadrant pain: Secondary | ICD-10-CM | POA: Diagnosis not present

## 2020-09-29 DIAGNOSIS — R109 Unspecified abdominal pain: Secondary | ICD-10-CM | POA: Diagnosis not present

## 2020-09-29 DIAGNOSIS — Z1389 Encounter for screening for other disorder: Secondary | ICD-10-CM | POA: Diagnosis not present

## 2020-09-29 LAB — POCT I-STAT CREATININE: Creatinine, Ser: 0.8 mg/dL (ref 0.44–1.00)

## 2020-09-29 MED ORDER — IOHEXOL 300 MG/ML  SOLN
100.0000 mL | Freq: Once | INTRAMUSCULAR | Status: AC | PRN
  Administered 2020-09-29: 100 mL via INTRAVENOUS

## 2020-10-02 DIAGNOSIS — E119 Type 2 diabetes mellitus without complications: Secondary | ICD-10-CM | POA: Diagnosis not present

## 2020-10-02 DIAGNOSIS — E7849 Other hyperlipidemia: Secondary | ICD-10-CM | POA: Diagnosis not present

## 2020-10-02 DIAGNOSIS — Z6828 Body mass index (BMI) 28.0-28.9, adult: Secondary | ICD-10-CM | POA: Diagnosis not present

## 2020-10-02 DIAGNOSIS — I10 Essential (primary) hypertension: Secondary | ICD-10-CM | POA: Diagnosis not present

## 2020-10-02 DIAGNOSIS — E1165 Type 2 diabetes mellitus with hyperglycemia: Secondary | ICD-10-CM | POA: Diagnosis not present

## 2020-10-02 DIAGNOSIS — R21 Rash and other nonspecific skin eruption: Secondary | ICD-10-CM | POA: Diagnosis not present

## 2020-10-08 ENCOUNTER — Encounter (INDEPENDENT_AMBULATORY_CARE_PROVIDER_SITE_OTHER): Payer: Self-pay | Admitting: *Deleted

## 2020-10-09 ENCOUNTER — Telehealth (INDEPENDENT_AMBULATORY_CARE_PROVIDER_SITE_OTHER): Payer: Self-pay

## 2020-10-09 ENCOUNTER — Other Ambulatory Visit: Payer: Self-pay

## 2020-10-09 ENCOUNTER — Encounter (INDEPENDENT_AMBULATORY_CARE_PROVIDER_SITE_OTHER): Payer: Self-pay

## 2020-10-09 ENCOUNTER — Ambulatory Visit (INDEPENDENT_AMBULATORY_CARE_PROVIDER_SITE_OTHER): Payer: Medicare Other | Admitting: Gastroenterology

## 2020-10-09 ENCOUNTER — Encounter (INDEPENDENT_AMBULATORY_CARE_PROVIDER_SITE_OTHER): Payer: Self-pay | Admitting: Gastroenterology

## 2020-10-09 ENCOUNTER — Other Ambulatory Visit (INDEPENDENT_AMBULATORY_CARE_PROVIDER_SITE_OTHER): Payer: Self-pay

## 2020-10-09 DIAGNOSIS — R109 Unspecified abdominal pain: Secondary | ICD-10-CM | POA: Diagnosis not present

## 2020-10-09 DIAGNOSIS — Z800 Family history of malignant neoplasm of digestive organs: Secondary | ICD-10-CM

## 2020-10-09 DIAGNOSIS — Z1211 Encounter for screening for malignant neoplasm of colon: Secondary | ICD-10-CM

## 2020-10-09 MED ORDER — PEG 3350-KCL-NA BICARB-NACL 420 G PO SOLR: 4000.0000 mL | ORAL | 0 refills | Status: DC

## 2020-10-09 MED ORDER — NAPROXEN 500 MG PO TBEC: 500.0000 mg | DELAYED_RELEASE_TABLET | Freq: Two times a day (BID) | ORAL | 0 refills | Status: AC

## 2020-10-09 NOTE — Progress Notes (Signed)
Lindsey Evans, M.D. Gastroenterology & Hepatology [facility] [address] [place],  [address] Primary Care Physician: Redmond School, MD [address] [place] [address]  Referring MD: PCP  Chief Complaint: Abdominal pain  History of Present Illness: Lindsey Evans is a 70 y.o. female with past medical history of diabetes, GERD, hypertension, who presents for evaluation of abdominal pain.  Patient reports that for the last 3 months she has felt worsening symptoms of pain in her R flank, which usually happens when she gets up from her bed or when she turns over. She also reports pain is worse when she bends down or if she has had a lot of physical activity that day. She only wakes up  when sleeping due to pain if she turns over.She also feels her back "feels tired frequently".  The patient denies having any nausea, vomiting, fever, chills, hematochezia, melena, hematemesis, abdominal distention, abdominal pain, heartburn, dysphagia, diarrhea, jaundice, pruritus or weight loss.  She has taken Tylenol which improves her symptoms for a short term, especially at night.  Given persistent symptoms, the patient was scheduled for a CT abdomen pelvis with IV contrast which she performed on 09/29/2020.  I personally evaluated this study and I did not observe any alterations in the abdomen explain her abdominal pain.  She had evidence of a prior cholecystectomy and hysterectomy, also presence of some diverticulosis.  Last EGD:2016  Wavy GE junction with single patch of salmon colored mucosa greater than 10 mm in length. Biopsy taken from this area post dilation.  No evidence of esophageal stricture or erosions.  Fine nodularity noted to fundal mucosa. Biopsies taken.  Esophageal dilation with 54 French Maloney dilator resulting in linear mucosal disruption  at cervical esophagus indicative of disrupted web. Last Colonoscopy:  never  FHx: neg for any gastrointestinal/liver disease, father colon cancer at age 77 Social: neg smoking, alcohol or illicit drug use Surgical: cholecystectomy, hysterectomy  Past Medical History: Past Medical History:  Diagnosis Date  . Diabetes mellitus without complication ([place])   . GERD (gastroesophageal reflux disease)   . Hypertension   . Neck pain   . Vaginal dryness 12/14/2012    Past Surgical History: Past Surgical History:  Procedure Laterality Date  . ABDOMINAL HYSTERECTOMY    . CHOLECYSTECTOMY     Gallstones  . ESOPHAGOGASTRODUODENOSCOPY N/A 07/03/2015   Procedure: ESOPHAGOGASTRODUODENOSCOPY (EGD);  Surgeon: Rogene Houston, MD;  [facility];  Service: Endoscopy;  Laterality: N/A;  2:40 - moved to 1:45 - Ann to notify    Family History: Family History  Problem Relation Age of Onset  . Hypertension Mother   . Cancer Father        prostate  . Cancer Paternal Aunt        breast  . Diabetes Paternal Uncle     Social History: Social History   Tobacco Use  Smoking Status Never Smoker  Smokeless Tobacco Never Used   Social History   Substance and Sexual Activity  Alcohol Use No   Social History   Substance and Sexual Activity  Drug Use No    Allergies: Allergies  Allergen Reactions  . Lipitor [Atorvastatin] Other (See Comments)    Leg cramps    Medications: Current Outpatient Medications  Medication Sig Dispense Refill  . ALPRAZolam (XANAX) 0.5 MG tablet Take 0.5 mg by mouth as needed for anxiety.     Marland Kitchen amLODipine (NORVASC) 5 MG tablet Take 5 mg by  mouth daily.    Marland Kitchen aspirin 81 MG tablet Take 81 mg by mouth daily.    Marland Kitchen DEXILANT 60 MG capsule TAKE (1) CAPSULE BY MOUTH ONCE DAILY. 30 capsule 4  . furosemide (LASIX) 20 MG tablet Take 20 mg by mouth daily.     . metoprolol succinate (TOPROL-XL) 50 MG 24 hr tablet Take 50 mg by mouth daily. Take with or immediately following a meal.    . hydrochlorothiazide (HYDRODIURIL) 25 MG tablet  Take 25 mg by mouth daily. (Patient not taking: Reported on 10/09/2020)    . rosuvastatin (CRESTOR) 20 MG tablet Take 1 tablet by mouth daily. (Patient not taking: Reported on 10/09/2020)     No current facility-administered medications for this visit.    Review of Systems: GENERAL: negative for malaise, night sweats HEENT: No changes in hearing or vision, no nose bleeds or other nasal problems. NECK: Negative for lumps, goiter, pain and significant neck swelling RESPIRATORY: Negative for cough, wheezing CARDIOVASCULAR: Negative for chest pain, leg swelling, palpitations, orthopnea GI: SEE HPI MUSCULOSKELETAL: Negative for joint pain or swelling, back pain, and muscle pain. SKIN: Negative for lesions, rash PSYCH: Negative for sleep disturbance, mood disorder and recent psychosocial stressors. HEMATOLOGY Negative for prolonged bleeding, bruising easily, and swollen nodes. ENDOCRINE: Negative for cold or heat intolerance, polyuria, polydipsia and goiter. NEURO: negative for tremor, gait imbalance, syncope and seizures. The remainder of the review of systems is noncontributory.   Physical Exam: BP 95/63 (BP Location: Left Arm, Patient Position: Sitting, Cuff Size: Small)   Pulse 80   Temp 97.6 F (36.4 C) (Oral)   Ht 5\' 2"  (1.575 m)   Wt 158 lb (71.7 kg)   BMI 28.90 kg/m  GENERAL: The patient is AO x3, in no acute distress. HEENT: Head is normocephalic and atraumatic. EOMI are intact. Mouth is well hydrated and without lesions. NECK: Supple. No masses LUNGS: Clear to auscultation. No presence of rhonchi/wheezing/rales. Adequate chest expansion HEART: RRR, normal s1 and s2. ABDOMEN: pinpoint tenderness in R flank, no guarding, no peritoneal signs, and nondistended. BS +. No masses. EXTREMITIES: Without any cyanosis, clubbing, rash, lesions or edema. NEUROLOGIC: AOx3, no focal motor deficit. SKIN: no jaundice, no rashes   Imaging/Labs: as above  I personally reviewed and  interpreted the available labs, imaging and endoscopic files.  Impression and Plan: Lindsey Evans is a 70 y.o. female with past medical history of diabetes, GERD, hypertension, who presents for evaluation of abdominal pain.  Patient has presented pain that is located with movements of her body.  Given the presence of Carnett's sign with negative imaging evaluation recently with a CT scan, it is very likely her symptoms are related to abdominal wall pain.  I explained to the patient that she needs to take NSAIDs for the next [redacted] weeks along with the use of topical analgesics such as a lidocaine patch.  She may benefit from physical therapy in the future if her symptoms persist.  I do not consider any further work-up is warranted for this.  I had a thorough discussion with the patient regarding colorectal cancer prevention with a colonoscopy.  Patient agreed to proceed with a colonoscopy for screening purposes.  - Start naproxen 500 mg q12h for 4 weeks - Can use lidocaine patches as needed for persistent abdominal pain - Schedule colonoscopy  All questions were answered.      Lindsey Peppers, MD Gastroenterology and Hepatology [facility]

## 2020-10-09 NOTE — Telephone Encounter (Signed)
Lindsey Evans, CMA  

## 2020-10-09 NOTE — H&P (View-Only) (Signed)
[name], M.D. Gastroenterology & Hepatology [facility] [address] [place], [place] [address] Primary Care Physician: Redmond School, MD [address] [place] [address]  Referring MD: PCP  Chief Complaint: Abdominal pain  History of Present Illness: Lindsey Evans is a 70 y.o. female with past medical history of diabetes, GERD, hypertension, who presents for evaluation of abdominal pain.  Patient reports that for the last 3 months she has felt worsening symptoms of pain in her R flank, which usually happens when she gets up from her bed or when she turns over. She also reports pain is worse when she bends down or if she has had a lot of physical activity that day. She only wakes up  when sleeping due to pain if she turns over.She also feels her back "feels tired frequently".  The patient denies having any nausea, vomiting, fever, chills, hematochezia, melena, hematemesis, abdominal distention, abdominal pain, heartburn, dysphagia, diarrhea, jaundice, pruritus or weight loss.  She has taken Tylenol which improves her symptoms for a short term, especially at night.  Given persistent symptoms, the patient was scheduled for a CT abdomen pelvis with IV contrast which she performed on 09/29/2020.  I personally evaluated this study and I did not observe any alterations in the abdomen explain her abdominal pain.  She had evidence of a prior cholecystectomy and hysterectomy, also presence of some diverticulosis.  Last EGD:2016  Wavy GE junction with single patch of salmon colored mucosa greater than 10 mm in length. Biopsy taken from this area post dilation.  No evidence of esophageal stricture or erosions.  Fine nodularity noted to fundal mucosa. Biopsies taken.  Esophageal dilation with 54 French Maloney dilator resulting in linear mucosal disruption  at cervical esophagus indicative of disrupted web. Last Colonoscopy:  never  FHx: neg for any gastrointestinal/liver disease, father colon cancer at age 31 Social: neg smoking, alcohol or illicit drug use Surgical: cholecystectomy, hysterectomy  Past Medical History: Past Medical History:  Diagnosis Date  . Diabetes mellitus without complication ([place])   . GERD (gastroesophageal reflux disease)   . Hypertension   . Neck pain   . Vaginal dryness 12/14/2012    Past Surgical History: Past Surgical History:  Procedure Laterality Date  . ABDOMINAL HYSTERECTOMY    . CHOLECYSTECTOMY     Gallstones  . ESOPHAGOGASTRODUODENOSCOPY N/A 07/03/2015   Procedure: ESOPHAGOGASTRODUODENOSCOPY (EGD);  Surgeon: Rogene Houston, MD;  [facility];  Service: Endoscopy;  Laterality: N/A;  2:40 - moved to 1:45 - Ann to notify    Family History: Family History  Problem Relation Age of Onset  . Hypertension Mother   . Cancer Father        prostate  . Cancer Paternal Aunt        breast  . Diabetes Paternal Uncle     Social History: Social History   Tobacco Use  Smoking Status Never Smoker  Smokeless Tobacco Never Used   Social History   Substance and Sexual Activity  Alcohol Use No   Social History   Substance and Sexual Activity  Drug Use No    Allergies: Allergies  Allergen Reactions  . Lipitor [Atorvastatin] Other (See Comments)    Leg cramps    Medications: Current Outpatient Medications  Medication Sig Dispense Refill  . ALPRAZolam (XANAX) 0.5 MG tablet Take 0.5 mg by mouth as needed for anxiety.     Marland Kitchen amLODipine (NORVASC) 5 MG tablet Take 5 mg by  mouth daily.    Marland Kitchen aspirin 81 MG tablet Take 81 mg by mouth daily.    Marland Kitchen DEXILANT 60 MG capsule TAKE (1) CAPSULE BY MOUTH ONCE DAILY. 30 capsule 4  . furosemide (LASIX) 20 MG tablet Take 20 mg by mouth daily.     . metoprolol succinate (TOPROL-XL) 50 MG 24 hr tablet Take 50 mg by mouth daily. Take with or immediately following a meal.    . hydrochlorothiazide (HYDRODIURIL) 25 MG tablet  Take 25 mg by mouth daily. (Patient not taking: Reported on 10/09/2020)    . rosuvastatin (CRESTOR) 20 MG tablet Take 1 tablet by mouth daily. (Patient not taking: Reported on 10/09/2020)     No current facility-administered medications for this visit.    Review of Systems: GENERAL: negative for malaise, night sweats HEENT: No changes in hearing or vision, no nose bleeds or other nasal problems. NECK: Negative for lumps, goiter, pain and significant neck swelling RESPIRATORY: Negative for cough, wheezing CARDIOVASCULAR: Negative for chest pain, leg swelling, palpitations, orthopnea GI: SEE HPI MUSCULOSKELETAL: Negative for joint pain or swelling, back pain, and muscle pain. SKIN: Negative for lesions, rash PSYCH: Negative for sleep disturbance, mood disorder and recent psychosocial stressors. HEMATOLOGY Negative for prolonged bleeding, bruising easily, and swollen nodes. ENDOCRINE: Negative for cold or heat intolerance, polyuria, polydipsia and goiter. NEURO: negative for tremor, gait imbalance, syncope and seizures. The remainder of the review of systems is noncontributory.   Physical Exam: BP 95/63 (BP Location: Left Arm, Patient Position: Sitting, Cuff Size: Small)   Pulse 80   Temp 97.6 F (36.4 C) (Oral)   Ht 5\' 2"  (1.575 m)   Wt 158 lb (71.7 kg)   BMI 28.90 kg/m  GENERAL: The patient is AO x3, in no acute distress. HEENT: Head is normocephalic and atraumatic. EOMI are intact. Mouth is well hydrated and without lesions. NECK: Supple. No masses LUNGS: Clear to auscultation. No presence of rhonchi/wheezing/rales. Adequate chest expansion HEART: RRR, normal s1 and s2. ABDOMEN: pinpoint tenderness in R flank, no guarding, no peritoneal signs, and nondistended. BS +. No masses. EXTREMITIES: Without any cyanosis, clubbing, rash, lesions or edema. NEUROLOGIC: AOx3, no focal motor deficit. SKIN: no jaundice, no rashes   Imaging/Labs: as above  I personally reviewed and  interpreted the available labs, imaging and endoscopic files.  Impression and Plan: Lindsey Evans is a 70 y.o. female with past medical history of diabetes, GERD, hypertension, who presents for evaluation of abdominal pain.  Patient has presented pain that is located with movements of her body.  Given the presence of Carnett's sign with negative imaging evaluation recently with a CT scan, it is very likely her symptoms are related to abdominal wall pain.  I explained to the patient that she needs to take NSAIDs for the next [redacted] weeks along with the use of topical analgesics such as a lidocaine patch.  She may benefit from physical therapy in the future if her symptoms persist.  I do not consider any further work-up is warranted for this.  I had a thorough discussion with the patient regarding colorectal cancer prevention with a colonoscopy.  Patient agreed to proceed with a colonoscopy for screening purposes.  - Start naproxen 500 mg q12h for 4 weeks - Can use lidocaine patches as needed for persistent abdominal pain - Schedule colonoscopy  All questions were answered.      [name], MD Gastroenterology and Hepatology [facility]

## 2020-10-09 NOTE — Patient Instructions (Signed)
Start naproxen 500 mg q12h for 4 weeks Can use lidocaine patches as needed for persistent abdominal pain, can buy over-the-counter Schedule colonoscopy

## 2020-10-13 ENCOUNTER — Other Ambulatory Visit (HOSPITAL_COMMUNITY)
Admission: RE | Admit: 2020-10-13 | Discharge: 2020-10-13 | Disposition: A | Discharge status: Home / Self Care | Payer: Medicare Other | Source: Ambulatory Visit | Attending: Gastroenterology | Admitting: Gastroenterology

## 2020-10-13 ENCOUNTER — Other Ambulatory Visit: Payer: Self-pay

## 2020-10-13 DIAGNOSIS — Z01812 Encounter for preprocedural laboratory examination: Secondary | ICD-10-CM | POA: Insufficient documentation

## 2020-10-13 DIAGNOSIS — Z20822 Contact with and (suspected) exposure to covid-19: Secondary | ICD-10-CM | POA: Diagnosis not present

## 2020-10-13 LAB — SARS CORONAVIRUS 2 (TAT 6-24 HRS): SARS Coronavirus 2: NEGATIVE

## 2020-10-14 ENCOUNTER — Encounter (HOSPITAL_COMMUNITY)
Admission: RE | Disposition: A | Discharge status: Home / Self Care | Payer: Self-pay | Source: Home / Self Care | Attending: Gastroenterology

## 2020-10-14 ENCOUNTER — Ambulatory Visit (HOSPITAL_COMMUNITY): Payer: Medicare Other | Admitting: Anesthesiology

## 2020-10-14 ENCOUNTER — Other Ambulatory Visit: Payer: Self-pay

## 2020-10-14 ENCOUNTER — Encounter (HOSPITAL_COMMUNITY): Payer: Self-pay | Admitting: Gastroenterology

## 2020-10-14 ENCOUNTER — Ambulatory Visit (HOSPITAL_COMMUNITY)
Admission: RE | Admit: 2020-10-14 | Discharge: 2020-10-14 | Disposition: A | Discharge status: Home / Self Care | Payer: Medicare Other | Attending: Gastroenterology | Admitting: Gastroenterology

## 2020-10-14 DIAGNOSIS — Z79899 Other long term (current) drug therapy: Secondary | ICD-10-CM | POA: Diagnosis not present

## 2020-10-14 DIAGNOSIS — I10 Essential (primary) hypertension: Secondary | ICD-10-CM | POA: Insufficient documentation

## 2020-10-14 DIAGNOSIS — Z800 Family history of malignant neoplasm of digestive organs: Secondary | ICD-10-CM

## 2020-10-14 DIAGNOSIS — Z1211 Encounter for screening for malignant neoplasm of colon: Secondary | ICD-10-CM | POA: Diagnosis not present

## 2020-10-14 DIAGNOSIS — K5730 Diverticulosis of large intestine without perforation or abscess without bleeding: Secondary | ICD-10-CM

## 2020-10-14 DIAGNOSIS — E119 Type 2 diabetes mellitus without complications: Secondary | ICD-10-CM | POA: Diagnosis not present

## 2020-10-14 DIAGNOSIS — Z7982 Long term (current) use of aspirin: Secondary | ICD-10-CM | POA: Insufficient documentation

## 2020-10-14 DIAGNOSIS — Z888 Allergy status to other drugs, medicaments and biological substances status: Secondary | ICD-10-CM | POA: Insufficient documentation

## 2020-10-14 HISTORY — PX: COLONOSCOPY WITH PROPOFOL: SHX5780

## 2020-10-14 LAB — GLUCOSE, CAPILLARY: Glucose-Capillary: 150 mg/dL — ABNORMAL HIGH (ref 70–99)

## 2020-10-14 LAB — HM COLONOSCOPY

## 2020-10-14 SURGERY — COLONOSCOPY WITH PROPOFOL
Anesthesia: General

## 2020-10-14 MED ORDER — STERILE WATER FOR IRRIGATION IR SOLN
Status: DC | PRN
  Administered 2020-10-14: 100 mL

## 2020-10-14 MED ORDER — LACTATED RINGERS IV SOLN: INTRAVENOUS | Status: DC

## 2020-10-14 MED ORDER — PROPOFOL 500 MG/50ML IV EMUL
INTRAVENOUS | Status: DC | PRN
  Administered 2020-10-14: 125 ug/kg/min via INTRAVENOUS

## 2020-10-14 MED ORDER — PROPOFOL 10 MG/ML IV BOLUS
INTRAVENOUS | Status: DC | PRN
  Administered 2020-10-14: 100 mg via INTRAVENOUS

## 2020-10-14 NOTE — Interval H&P Note (Signed)
History and Physical Interval Note:  10/14/2020 11:37 AM Lindsey Evans is a 70 y.o. female with past medical history of diabetes, GERD, hypertension, who presents for colorectal cancer screening.  Family history is remarkable for thyroid history of colon cancer at age 57.  Patient states that her abdominal pain has resolved after she took NSAIDs and lidocaine patch, as this was musculoskeletal in nature.  Denied having any symptoms at the moment.  No melena, hematochezia, abdominal pain, abdominal distention, fever or chills, no nausea or vomiting.  BP 127/83   Pulse 60   Temp 98.1 F (36.7 C) (Oral)   Resp 16   Ht 5\' 2"  (1.575 m)   Wt 71.7 kg   SpO2 98%   BMI 28.90 kg/m  GENERAL: The patient is AO x3, in no acute distress. HEENT: Head is normocephalic and atraumatic. EOMI are intact. Mouth is well hydrated and without lesions. NECK: Supple. No masses LUNGS: Clear to auscultation. No presence of rhonchi/wheezing/rales. Adequate chest expansion HEART: RRR, normal s1 and s2. ABDOMEN: Soft, nontender, no guarding, no peritoneal signs, and nondistended. BS +. No masses. EXTREMITIES: Without any cyanosis, clubbing, rash, lesions or edema. NEUROLOGIC: AOx3, no focal motor deficit. SKIN: no jaundice, no rashes   Lindsey Evans  has presented today for surgery, with the diagnosis of Screening Colonoscopy.  The various methods of treatment have been discussed with the patient and family. After consideration of risks, benefits and other options for treatment, the patient has consented to  Procedure(s) with comments: COLONOSCOPY WITH PROPOFOL (N/A) - 12:30 PM as a surgical intervention.  The patient's history has been reviewed, patient examined, no change in status, stable for surgery.  I have reviewed the patient's chart and labs.  Questions were answered to the patient's satisfaction.     Lindsey Evans

## 2020-10-14 NOTE — Op Note (Signed)
[facility] Patient Name: Lindsey Evans Procedure Date: 10/14/2020 11:33 AM MRN: [id number] Date of Birth: [birth year] Attending MD: [name] ,  CSN: [id number] Age: 70 Admit Type: Outpatient Procedure:                Colonoscopy Indications:              Screening patient at increased risk: Family history                            of 1st-degree relative with colorectal cancer at                            age 54 years (or older) Providers:                [name], Rosina Lowenstein, RN, Nelma Rothman,                            Technician Referring MD:              Medicines:                Monitored Anesthesia Care Complications:            No immediate complications. Estimated Blood Loss:     Estimated blood loss: none. Procedure:                Pre-Anesthesia Assessment:                           - Prior to the procedure, a History and Physical                            was performed, and patient medications, allergies                            and sensitivities were reviewed. The patient's                            tolerance of previous anesthesia was reviewed.                           - The risks and benefits of the procedure and the                            sedation options and risks were discussed with the                            patient. All questions were answered and informed                            consent was obtained.                           - ASA Grade Assessment: II - A patient with mild                            systemic disease.  After obtaining informed consent, the colonoscope                            was passed under direct vision. Throughout the                            procedure, the patient's blood pressure, pulse, and                            oxygen saturations were monitored continuously.The                            colonoscopy was performed without difficulty. The                             patient tolerated the procedure well. The quality                            of the bowel preparation was excellent. The                            [id number]-[id number] ([id number]) scope was introduced through                            the anus and advanced to the the cecum, identified                            by appendiceal orifice and ileocecal valve. Scope In: 12:56:55 PM Scope Out: 1:14:32 PM Scope Withdrawal Time: 0 hours 14 minutes 37 seconds  Total Procedure Duration: 0 hours 17 minutes 37 seconds  Findings:      The perianal and digital rectal examinations were normal.      Multiple small and large-mouthed diverticula were found in the sigmoid       colon, descending colon and ascending colon.      The retroflexed view of the distal rectum and anal verge was normal and       showed no anal or rectal abnormalities. Impression:               - Diverticulosis in the sigmoid colon, in the                            descending colon and in the ascending colon.                           - The distal rectum and anal verge are normal on                            retroflexion view.                           - No specimens collected. Moderate Sedation:      Per Anesthesia Care Recommendation:           - Discharge patient to home (ambulatory).                           -  Resume previous diet.                           - Repeat colonoscopy in 10 years for screening                            purposes. Procedure Code(s):        --- Professional ---                           Y8657, Colorectal cancer screening; colonoscopy on                            individual at high risk Diagnosis Code(s):        --- Professional ---                           Z80.0, Family history of malignant neoplasm of                            digestive organs                           K57.30, Diverticulosis of large intestine without                            perforation or abscess without bleeding CPT copyright 2019  American Medical Association. All rights reserved. The codes documented in this report are preliminary and upon coder review may  be revised to meet current compliance requirements. [name], MD [name],  10/14/2020 1:16:17 PM This report has been signed electronically. Number of Addenda: 0

## 2020-10-14 NOTE — Transfer of Care (Signed)
Immediate Anesthesia Transfer of Care Note  Patient: Lindsey Evans  Procedure(s) Performed: COLONOSCOPY WITH PROPOFOL (N/A )  Patient Location: PACU  Anesthesia Type:General  Level of Consciousness: awake and alert   Airway & Oxygen Therapy: Patient Spontanous Breathing  Post-op Assessment: Report given to RN and Post -op Vital signs reviewed and stable  Post vital signs: Reviewed and stable  Last Vitals:  Vitals Value Taken Time  BP    Temp    Pulse    Resp    SpO2      Last Pain:  Vitals:   10/14/20 1251  TempSrc:   PainSc: 0-No pain      Patients Stated Pain Goal: 6 (56/86/16 [id number])  Complications: No complications documented.

## 2020-10-14 NOTE — Discharge Instructions (Signed)
Diverticulosis  Diverticulosis is a condition that develops when small pouches (diverticula) form in the wall of the large intestine (colon). The colon is where water is absorbed and stool (feces) is formed. The pouches form when the inside layer of the colon pushes through weak spots in the outer layers of the colon. You may have a few pouches or many of them. The pouches usually do not cause problems unless they become inflamed or infected. When this happens, the condition is called diverticulitis. What are the causes? The cause of this condition is not known. What increases the risk? The following factors may make you more likely to develop this condition:  Being older than age 60. Your risk for this condition increases with age. Diverticulosis is rare among people younger than age 30. By age 80, many people have it.  Eating a low-fiber diet.  Having frequent constipation.  Being overweight.  Not getting enough exercise.  Smoking.  Taking over-the-counter pain medicines, like aspirin and ibuprofen.  Having a family history of diverticulosis. What are the signs or symptoms? In most people, there are no symptoms of this condition. If you do have symptoms, they may include:  Bloating.  Cramps in the abdomen.  Constipation or diarrhea.  Pain in the lower left side of the abdomen. How is this diagnosed? Because diverticulosis usually has no symptoms, it is most often diagnosed during an exam for other colon problems. The condition may be diagnosed by:  Using a flexible scope to examine the colon (colonoscopy).  Taking an X-ray of the colon after dye has been put into the colon (barium enema).  Having a CT scan. How is this treated? You may not need treatment for this condition. Your health care provider may recommend treatment to prevent problems. You may need treatment if you have symptoms or if you previously had diverticulitis. Treatment may include:  Eating a high-fiber  diet.  Taking a fiber supplement.  Taking a live bacteria supplement (probiotic).  Taking medicine to relax your colon.   Follow these instructions at home: Medicines  Take over-the-counter and prescription medicines only as told by your health care provider.  If told by your health care provider, take a fiber supplement or probiotic. Constipation prevention Your condition may cause constipation. To prevent or treat constipation, you may need to:  Drink enough fluid to keep your urine pale yellow.  Take over-the-counter or prescription medicines.  Eat foods that are high in fiber, such as beans, whole grains, and fresh fruits and vegetables.  Limit foods that are high in fat and processed sugars, such as fried or sweet foods.   General instructions  Try not to strain when you have a bowel movement.  Keep all follow-up visits as told by your health care provider. This is important. Contact a health care provider if you:  Have pain in your abdomen.  Have bloating.  Have cramps.  Have not had a bowel movement in 3 days. Get help right away if:  Your pain gets worse.  Your bloating becomes very bad.  You have a fever or chills, and your symptoms suddenly get worse.  You vomit.  You have bowel movements that are bloody or black.  You have bleeding from your rectum. Summary  Diverticulosis is a condition that develops when small pouches (diverticula) form in the wall of the large intestine (colon).  You may have a few pouches or many of them.  This condition is most often diagnosed during an exam   for other colon problems.  Treatment may include increasing the fiber in your diet, taking supplements, or taking medicines. This information is not intended to replace advice given to you by your health care provider. Make sure you discuss any questions you have with your health care provider. Document Revised: 01/04/2019 Document Reviewed: 01/04/2019 Elsevier Patient  Education  2021 [place]. Colonoscopy, Adult, Care After This sheet gives you information about how to care for yourself after your procedure. Your health care provider may also give you more specific instructions. If you have problems or questions, contact your health care provider. What can I expect after the procedure? After the procedure, it is common to have:  A small amount of blood in your stool for 24 hours after the procedure.  Some gas.  Mild cramping or bloating of your abdomen. Follow these instructions at home: Eating and drinking  Drink enough fluid to keep your urine pale yellow.  Follow instructions from your health care provider about eating or drinking restrictions.  Resume your normal diet as instructed by your health care provider. Avoid heavy or fried foods that are hard to digest.   Activity  Rest as told by your health care provider.  Avoid sitting for a long time without moving. Get up to take short walks every 1-2 hours. This is important to improve blood flow and breathing. Ask for help if you feel weak or unsteady.  Return to your normal activities as told by your health care provider. Ask your health care provider what activities are safe for you. Managing cramping and bloating  Try walking around when you have cramps or feel bloated.  Apply heat to your abdomen as told by your health care provider. Use the heat source that your health care provider recommends, such as a moist heat pack or a heating pad. ? Place a towel between your skin and the heat source. ? Leave the heat on for 20-30 minutes. ? Remove the heat if your skin turns bright red. This is especially important if you are unable to feel pain, heat, or cold. You may have a greater risk of getting burned.   General instructions  If you were given a sedative during the procedure, it can affect you for several hours. Do not drive or operate machinery until your health care provider says  that it is safe.  For the first 24 hours after the procedure: ? Do not sign important documents. ? Do not drink alcohol. ? Do your regular daily activities at a slower pace than normal. ? Eat soft foods that are easy to digest.  Take over-the-counter and prescription medicines only as told by your health care provider.  Keep all follow-up visits as told by your health care provider. This is important. Contact a health care provider if:  You have blood in your stool 2-3 days after the procedure. Get help right away if you have:  More than a small spotting of blood in your stool.  Large blood clots in your stool.  Swelling of your abdomen.  Nausea or vomiting.  A fever.  Increasing pain in your abdomen that is not relieved with medicine. Summary  After the procedure, it is common to have a small amount of blood in your stool. You may also have mild cramping and bloating of your abdomen.  If you were given a sedative during the procedure, it can affect you for several hours. Do not drive or operate machinery until your health care provider  says that it is safe.  Get help right away if you have a lot of blood in your stool, nausea or vomiting, a fever, or increased pain in your abdomen. This information is not intended to replace advice given to you by your health care provider. Make sure you discuss any questions you have with your health care provider. Document Revised: 06/01/2019 Document Reviewed: 01/01/2019 Elsevier Patient Education  2021 [place] are being discharged to home.  Resume your previous diet.  Your physician has recommended a repeat colonoscopy in 10 years for screening purposes.

## 2020-10-14 NOTE — Anesthesia Postprocedure Evaluation (Signed)
Anesthesia Post Note  Patient: Lindsey Evans  Procedure(s) Performed: COLONOSCOPY WITH PROPOFOL (N/A )  Patient location during evaluation: Endoscopy Anesthesia Type: General Level of consciousness: awake and alert and oriented Pain management: pain level controlled Vital Signs Assessment: post-procedure vital signs reviewed and stable Respiratory status: spontaneous breathing and respiratory function stable Cardiovascular status: blood pressure returned to baseline and stable Postop Assessment: no apparent nausea or vomiting Anesthetic complications: no   No complications documented.   Last Vitals:  Vitals:   10/14/20 1057 10/14/20 1316  BP: 127/83 95/61  Pulse: 60 (!) 56  Resp: 16 (!) 22  Temp: 36.7 C 36.4 C  SpO2: 98% 98%    Last Pain:  Vitals:   10/14/20 1316  TempSrc: Oral  PainSc: 0-No pain                 Irvine Glorioso C Deshon Koslowski

## 2020-10-14 NOTE — Anesthesia Preprocedure Evaluation (Signed)
Anesthesia Evaluation  Patient identified by MRN, date of birth, ID band Patient awake    Reviewed: Allergy & Precautions, NPO status , Patient's Chart, lab work & pertinent test results, reviewed documented beta blocker date and time   History of Anesthesia Complications (+) history of anesthetic complications  Airway Mallampati: II  TM Distance: >3 FB Neck ROM: Full    Dental  (+) Dental Advisory Given, Missing   Pulmonary neg pulmonary ROS,    Pulmonary exam normal breath sounds clear to auscultation       Cardiovascular Exercise Tolerance: Good hypertension, Pt. on medications and Pt. on home beta blockers Normal cardiovascular exam Rhythm:Regular Rate:Normal     Neuro/Psych negative neurological ROS  negative psych ROS   GI/Hepatic Neg liver ROS, GERD  Medicated and Controlled,  Endo/Other  diabetes, Well Controlled, Type 2, Oral Hypoglycemic Agents  Renal/GU      Musculoskeletal   Abdominal   Peds  Hematology   Anesthesia Other Findings   Reproductive/Obstetrics                             Anesthesia Physical Anesthesia Plan  ASA: II  Anesthesia Plan: General   Post-op Pain Management:    Induction: Intravenous  PONV Risk Score and Plan: Propofol infusion  Airway Management Planned: Nasal Cannula and Natural Airway  Additional Equipment:   Intra-op Plan:   Post-operative Plan:   Informed Consent: I have reviewed the patients History and Physical, chart, labs and discussed the procedure including the risks, benefits and alternatives for the proposed anesthesia with the patient or authorized representative who has indicated his/her understanding and acceptance.     Dental advisory given  Plan Discussed with: CRNA and Surgeon  Anesthesia Plan Comments:         Anesthesia Quick Evaluation

## 2020-10-15 ENCOUNTER — Encounter (INDEPENDENT_AMBULATORY_CARE_PROVIDER_SITE_OTHER): Payer: Self-pay | Admitting: *Deleted

## 2020-10-16 ENCOUNTER — Encounter (HOSPITAL_COMMUNITY): Payer: Self-pay | Admitting: Gastroenterology

## 2020-10-30 DIAGNOSIS — I10 Essential (primary) hypertension: Secondary | ICD-10-CM | POA: Diagnosis not present

## 2020-10-30 DIAGNOSIS — R21 Rash and other nonspecific skin eruption: Secondary | ICD-10-CM | POA: Diagnosis not present

## 2020-10-30 DIAGNOSIS — K219 Gastro-esophageal reflux disease without esophagitis: Secondary | ICD-10-CM | POA: Diagnosis not present

## 2020-10-30 DIAGNOSIS — E663 Overweight: Secondary | ICD-10-CM | POA: Diagnosis not present

## 2020-10-30 DIAGNOSIS — F419 Anxiety disorder, unspecified: Secondary | ICD-10-CM | POA: Diagnosis not present

## 2020-10-30 DIAGNOSIS — Z6829 Body mass index (BMI) 29.0-29.9, adult: Secondary | ICD-10-CM | POA: Diagnosis not present

## 2020-10-30 DIAGNOSIS — E119 Type 2 diabetes mellitus without complications: Secondary | ICD-10-CM | POA: Diagnosis not present

## 2020-11-20 DIAGNOSIS — L710 Perioral dermatitis: Secondary | ICD-10-CM | POA: Diagnosis not present

## 2021-01-27 ENCOUNTER — Encounter: Payer: Self-pay | Admitting: Podiatry

## 2021-01-27 ENCOUNTER — Ambulatory Visit (INDEPENDENT_AMBULATORY_CARE_PROVIDER_SITE_OTHER): Payer: Medicare Other

## 2021-01-27 ENCOUNTER — Ambulatory Visit (INDEPENDENT_AMBULATORY_CARE_PROVIDER_SITE_OTHER): Payer: Medicare Other | Admitting: Podiatry

## 2021-01-27 ENCOUNTER — Other Ambulatory Visit: Payer: Self-pay

## 2021-01-27 DIAGNOSIS — E119 Type 2 diabetes mellitus without complications: Secondary | ICD-10-CM | POA: Insufficient documentation

## 2021-01-27 DIAGNOSIS — M778 Other enthesopathies, not elsewhere classified: Secondary | ICD-10-CM

## 2021-01-27 DIAGNOSIS — M5010 Cervical disc disorder with radiculopathy, unspecified cervical region: Secondary | ICD-10-CM | POA: Insufficient documentation

## 2021-01-27 DIAGNOSIS — D2372 Other benign neoplasm of skin of left lower limb, including hip: Secondary | ICD-10-CM | POA: Diagnosis not present

## 2021-01-27 DIAGNOSIS — D2371 Other benign neoplasm of skin of right lower limb, including hip: Secondary | ICD-10-CM

## 2021-01-28 NOTE — Progress Notes (Signed)
Subjective:  Patient ID: Lindsey Evans, female    DOB: 14-Feb-1951,  MRN: [id number] HPI Chief Complaint  Patient presents with   Foot Pain    Plantar heel bilateral (L>R) - "soreness, not necessarily pain" x couple months, no AM pain, callused heel left, tried soaks   Diabetes    Last a1c was 7.0   New Patient (Initial Visit)    70 y.o. female presents with the above complaint.   ROS: Denies fever chills nausea vomiting muscle aches pains calf pain back pain chest pain shortness of breath.  Past Medical History:  Diagnosis Date   Diabetes mellitus without complication (HCC)    GERD (gastroesophageal reflux disease)    Hypertension    Neck pain    Vaginal dryness 12/14/2012   Past Surgical History:  Procedure Laterality Date   ABDOMINAL HYSTERECTOMY     CHOLECYSTECTOMY     Gallstones   COLONOSCOPY WITH PROPOFOL N/A 10/14/2020   Procedure: COLONOSCOPY WITH PROPOFOL;  Surgeon: Harvel Quale, MD;  Location: [facility];  Service: Gastroenterology;  Laterality: N/A;  12:30 PM   ESOPHAGOGASTRODUODENOSCOPY N/A 07/03/2015   Procedure: ESOPHAGOGASTRODUODENOSCOPY (EGD);  Surgeon: Rogene Houston, MD;  Location: [facility];  Service: Endoscopy;  Laterality: N/A;  2:40 - moved to 1:45 - Ann to notify    Current Outpatient Medications:    ALPRAZolam (XANAX) 0.5 MG tablet, Take 0.5 mg by mouth every 6 (six) hours as needed for anxiety., Disp: , Rfl:    amLODipine (NORVASC) 5 MG tablet, Take 5 mg by mouth daily., Disp: , Rfl:    aspirin 81 MG tablet, Take 81 mg by mouth daily., Disp: , Rfl:    esomeprazole (NEXIUM) 40 MG capsule, Take 40 mg by mouth daily as needed (acid reflux)., Disp: , Rfl:    glimepiride (AMARYL) 2 MG tablet, Take 2 mg by mouth 2 (two) times daily., Disp: , Rfl:    hydrochlorothiazide (HYDRODIURIL) 25 MG tablet, Take 25 mg by mouth daily., Disp: , Rfl:    Lidocaine 4 % PTCH, Apply 1 patch topically 2 (two) times daily as needed (pain)., Disp: , Rfl:     metoprolol succinate (TOPROL-XL) 50 MG 24 hr tablet, Take 50 mg by mouth daily. Take with or immediately following a meal., Disp: , Rfl:    minocycline (MINOCIN) 100 MG capsule, Take 100 mg by mouth 2 (two) times daily., Disp: , Rfl:    OneTouch Delica Lancets [id number] MISC, 2 (two) times daily., Disp: , Rfl:    ONETOUCH ULTRA test strip, 2 (two) times daily as needed., Disp: , Rfl:    pioglitazone (ACTOS) 45 MG tablet, Take 45 mg by mouth daily., Disp: , Rfl:   Allergies  Allergen Reactions   Lipitor [Atorvastatin] Other (See Comments)    Leg cramps   Review of Systems Objective:  There were no vitals filed for this visit.  General: Well developed, nourished, in no acute distress, alert and oriented x3   Dermatological: Skin is warm, dry and supple bilateral. Nails x 10 are well maintained; remaining integument appears unremarkable at this time. There are no open sores, no preulcerative lesions, no rash or signs of infection present.  Reactive hyper keratomas plantar aspect of the bilateral heels  Vascular: Dorsalis Pedis artery and Posterior Tibial artery pedal pulses are 2/4 bilateral with immedate capillary fill time. Pedal hair growth present. No varicosities and no lower extremity edema present bilateral.   Neruologic: Grossly intact via light touch bilateral. Vibratory  intact via tuning fork bilateral. Protective threshold with Semmes Wienstein monofilament intact to all pedal sites bilateral. Patellar and Achilles deep tendon reflexes 2+ bilateral. No Babinski or clonus noted bilateral.   Musculoskeletal: No gross boney pedal deformities bilateral. No pain, crepitus, or limitation noted with foot and ankle range of motion bilateral. Muscular strength 5/5 in all groups tested bilateral.  Gait: Unassisted, Nonantalgic.    Radiographs:  Osseously mature individual demonstrating posterior and plantar heel spurs with mild hallux limitus and joint space narrowing first  metatarsophalangeal joint right foot.  Assessment & Plan:   Assessment: Hallux limitus right foot.  Benign skin lesions plantar aspect of the bilateral foot  Plan: Debridement of benign skin lesions.  Follow-up with Dr. Adah Perl as needed     Wilson Sample T. Taylor, [place]

## 2021-02-11 DIAGNOSIS — M5030 Other cervical disc degeneration, unspecified cervical region: Secondary | ICD-10-CM | POA: Diagnosis not present

## 2021-02-11 DIAGNOSIS — G459 Transient cerebral ischemic attack, unspecified: Secondary | ICD-10-CM | POA: Diagnosis not present

## 2021-02-11 DIAGNOSIS — M542 Cervicalgia: Secondary | ICD-10-CM | POA: Diagnosis not present

## 2021-02-11 DIAGNOSIS — G4486 Cervicogenic headache: Secondary | ICD-10-CM | POA: Diagnosis not present

## 2021-02-11 DIAGNOSIS — M5010 Cervical disc disorder with radiculopathy, unspecified cervical region: Secondary | ICD-10-CM | POA: Diagnosis not present

## 2021-02-11 DIAGNOSIS — G43109 Migraine with aura, not intractable, without status migrainosus: Secondary | ICD-10-CM | POA: Diagnosis not present

## 2021-02-19 DIAGNOSIS — L710 Perioral dermatitis: Secondary | ICD-10-CM | POA: Diagnosis not present

## 2021-03-18 DIAGNOSIS — I10 Essential (primary) hypertension: Secondary | ICD-10-CM | POA: Diagnosis not present

## 2021-03-18 DIAGNOSIS — F419 Anxiety disorder, unspecified: Secondary | ICD-10-CM | POA: Diagnosis not present

## 2021-03-18 DIAGNOSIS — E1165 Type 2 diabetes mellitus with hyperglycemia: Secondary | ICD-10-CM | POA: Diagnosis not present

## 2021-03-18 DIAGNOSIS — E538 Deficiency of other specified B group vitamins: Secondary | ICD-10-CM | POA: Diagnosis not present

## 2021-03-18 DIAGNOSIS — E063 Autoimmune thyroiditis: Secondary | ICD-10-CM | POA: Diagnosis not present

## 2021-03-18 DIAGNOSIS — E119 Type 2 diabetes mellitus without complications: Secondary | ICD-10-CM | POA: Diagnosis not present

## 2021-04-01 DIAGNOSIS — G40009 Localization-related (focal) (partial) idiopathic epilepsy and epileptic syndromes with seizures of localized onset, not intractable, without status epilepticus: Secondary | ICD-10-CM | POA: Diagnosis not present

## 2021-04-02 DIAGNOSIS — L710 Perioral dermatitis: Secondary | ICD-10-CM | POA: Diagnosis not present

## 2021-04-06 DIAGNOSIS — Z23 Encounter for immunization: Secondary | ICD-10-CM | POA: Diagnosis not present

## 2021-05-05 DIAGNOSIS — M5010 Cervical disc disorder with radiculopathy, unspecified cervical region: Secondary | ICD-10-CM | POA: Diagnosis not present

## 2021-05-05 DIAGNOSIS — M542 Cervicalgia: Secondary | ICD-10-CM | POA: Diagnosis not present

## 2021-05-05 DIAGNOSIS — G459 Transient cerebral ischemic attack, unspecified: Secondary | ICD-10-CM | POA: Diagnosis not present

## 2021-05-05 DIAGNOSIS — G43109 Migraine with aura, not intractable, without status migrainosus: Secondary | ICD-10-CM | POA: Diagnosis not present

## 2021-05-05 DIAGNOSIS — M5030 Other cervical disc degeneration, unspecified cervical region: Secondary | ICD-10-CM | POA: Diagnosis not present

## 2021-05-05 DIAGNOSIS — G4486 Cervicogenic headache: Secondary | ICD-10-CM | POA: Diagnosis not present

## 2021-06-05 DIAGNOSIS — U071 COVID-19: Secondary | ICD-10-CM | POA: Diagnosis not present

## 2021-06-07 IMAGING — CT CT ABD-PELV W/ CM
2 of 5 series · 16 of 46 positions shown, 18 images · IV contrast (Omnipaque or Isovue)
Comparison: None.

CLINICAL DATA: Intermittent right lower quadrant pain for 3 months.

EXAM:
CT ABDOMEN AND PELVIS WITH CONTRAST
TECHNIQUE: Multidetector CT imaging of the abdomen and pelvis was performed
using the standard protocol following bolus administration of
intravenous contrast.
CONTRAST:  100mL OMNIPAQUE IOHEXOL 300 MG/ML  SOLN

[Series 2: axial st · axial · 0.85mm/px · z∈[+678,+1123]mm · 13 of 99 slices shown, 15 images]
[im 5/99  soft-tissue]
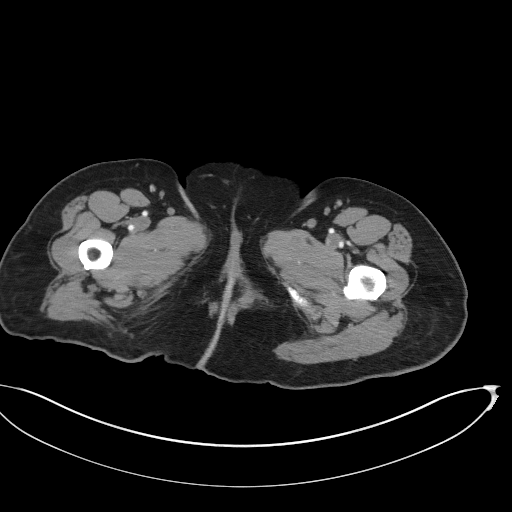
[im 5/99  bone]
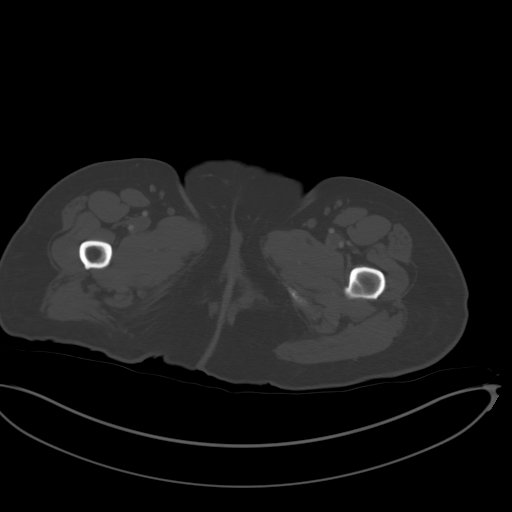
[im 15/99  soft-tissue]
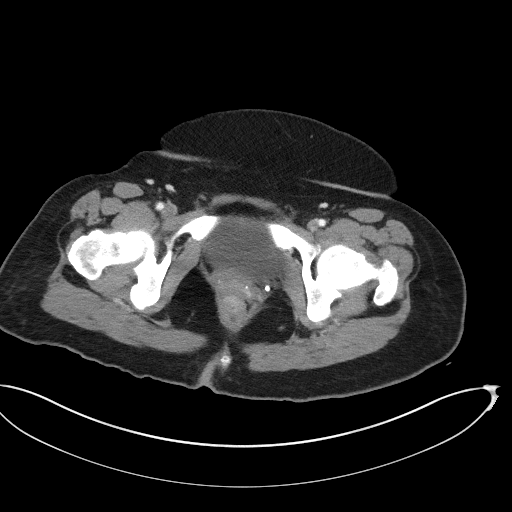
[im 20/99  soft-tissue]
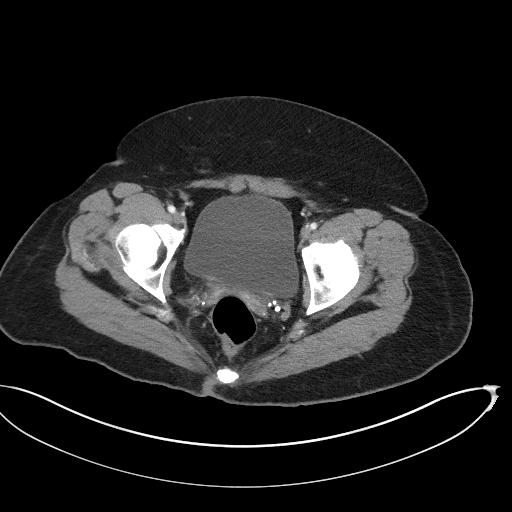
[im 30/99  soft-tissue]
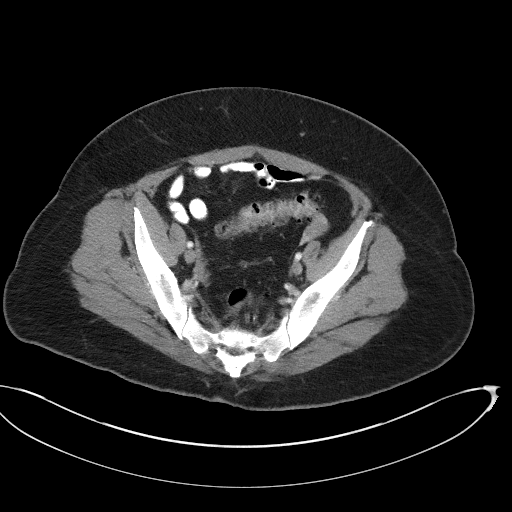
[im 35/99  soft-tissue]
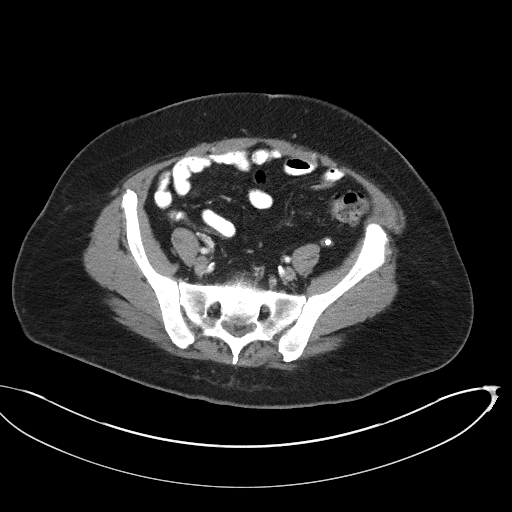
[im 45/99  soft-tissue]
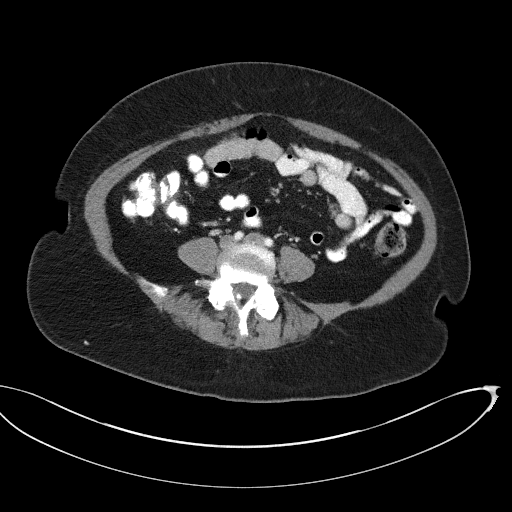
[im 50/99  soft-tissue]
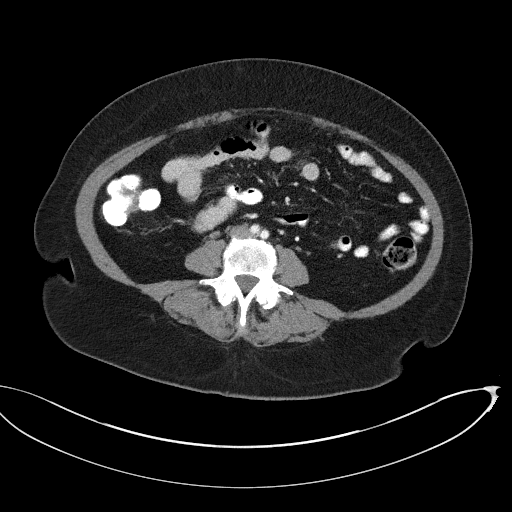
[im 54/99  soft-tissue]
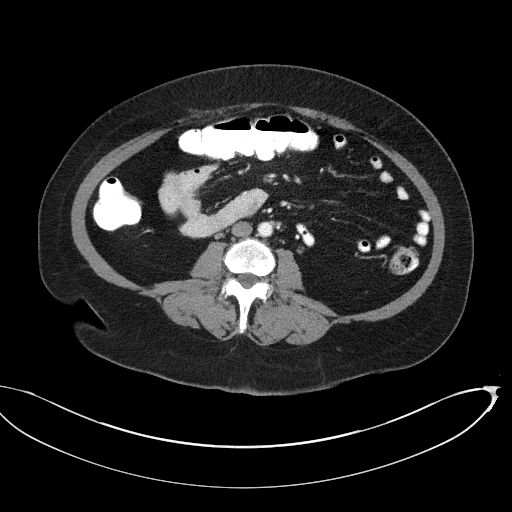
[im 64/99  soft-tissue]
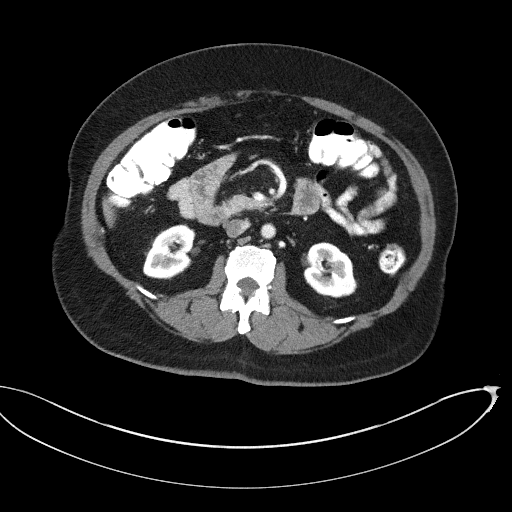
[im 64/99  bone]
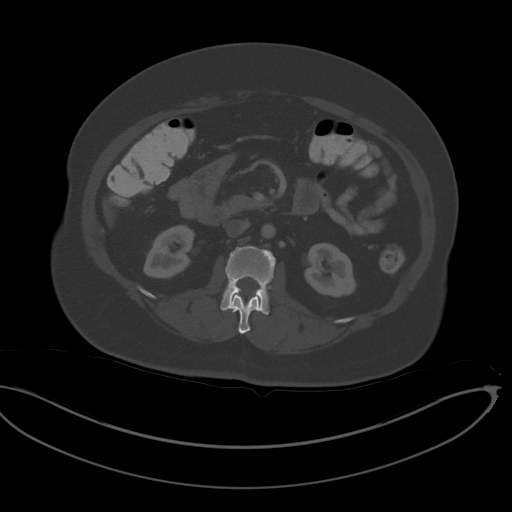
[im 69/99  soft-tissue]
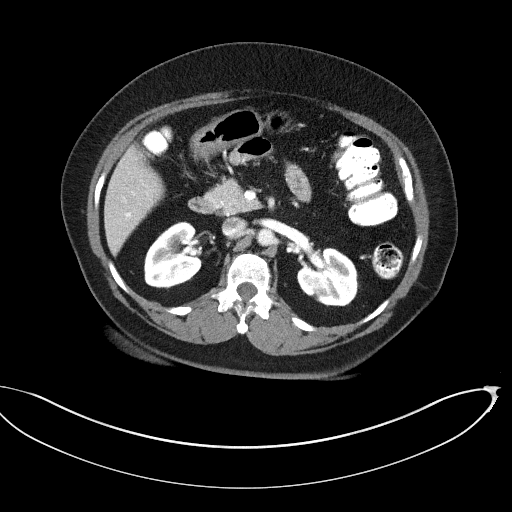
[im 79/99  soft-tissue]
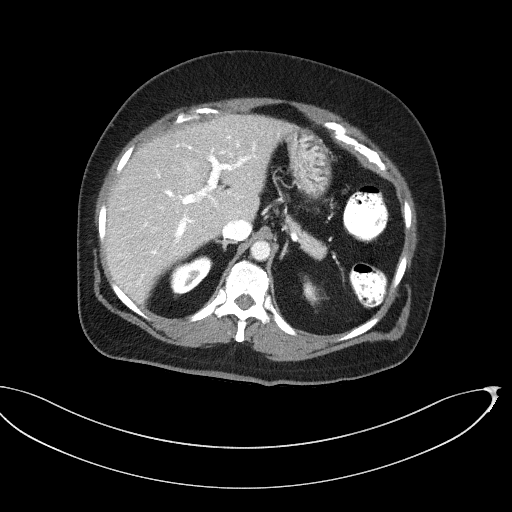
[im 84/99  soft-tissue]
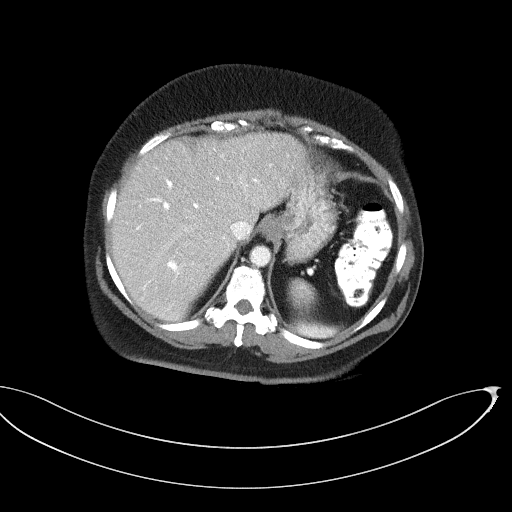
[im 94/99  soft-tissue]
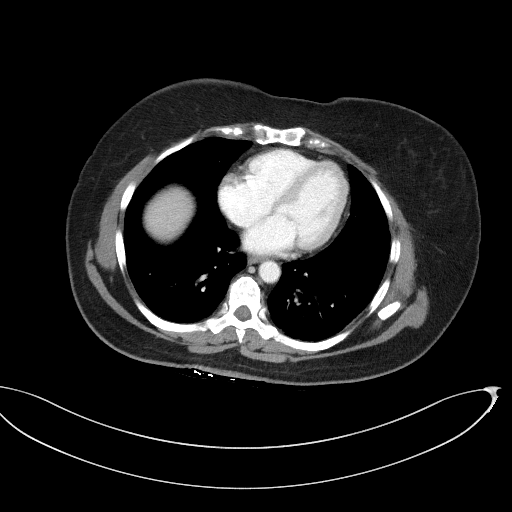

[Series 5: coronal st · coronal · 0.86mm/px · 3 of 118 slices shown]
[im 40/118  soft-tissue]
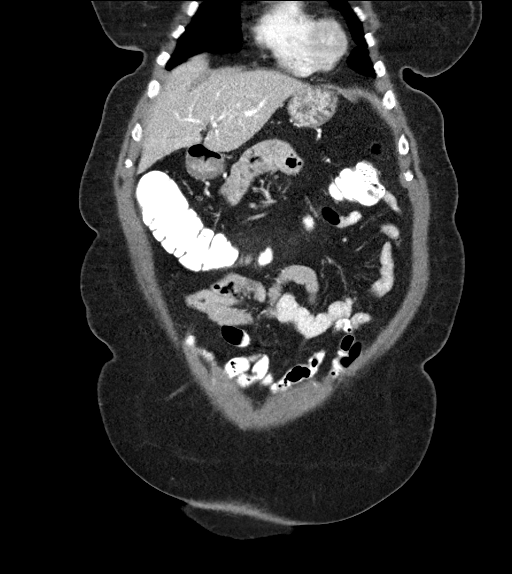
[im 53/118  soft-tissue]
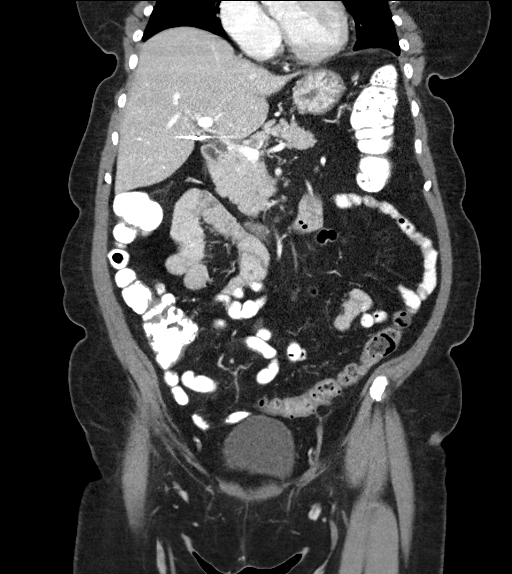
[im 66/118  soft-tissue]
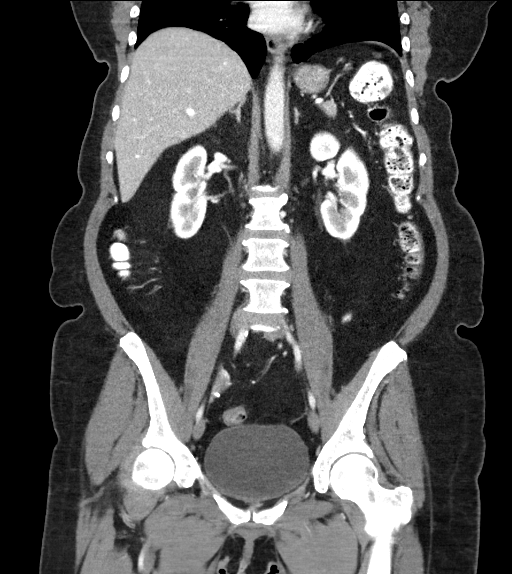

[16 of 46 positions shown; findings below may reference images not displayed]

FINDINGS: Lower chest: No acute abnormality.

Hepatobiliary: No focal liver abnormality is seen. Status post
cholecystectomy. No biliary dilatation.

Pancreas: Unremarkable. No pancreatic ductal dilatation or
surrounding inflammatory changes.

Spleen: Normal in size without focal abnormality.

Adrenals/Urinary Tract: No adrenal masses. Kidneys normal in size,
orientation and position. Bilateral duplicated intrarenal collecting
systems. No renal masses or stones. No hydronephrosis. Ureters
normal in course and in caliber. Bladder unremarkable.

Stomach/Bowel: Normal stomach. Small bowel and colon are normal in
caliber. No wall thickening. No bowel inflammation. Multiple
diverticula noted along the left colon. No diverticulitis. Normal
appendix visualized.

Vascular/Lymphatic: No significant vascular findings are present. No
enlarged abdominal or pelvic lymph nodes.

Reproductive: Status post hysterectomy. No adnexal masses.

Other: No abdominal wall hernia or abnormality. No abdominopelvic
ascites.

Musculoskeletal: No fracture or acute finding.  No bone lesion.
IMPRESSION: 1. No acute findings. No findings to account for right lower
quadrant pain. Normal appendix visualized.
2. Status post cholecystectomy and hysterectomy. Left colon
diverticulosis.

## 2021-07-20 DIAGNOSIS — E063 Autoimmune thyroiditis: Secondary | ICD-10-CM | POA: Diagnosis not present

## 2021-07-20 DIAGNOSIS — E782 Mixed hyperlipidemia: Secondary | ICD-10-CM | POA: Diagnosis not present

## 2021-07-20 DIAGNOSIS — Z0001 Encounter for general adult medical examination with abnormal findings: Secondary | ICD-10-CM | POA: Diagnosis not present

## 2021-07-20 DIAGNOSIS — E538 Deficiency of other specified B group vitamins: Secondary | ICD-10-CM | POA: Diagnosis not present

## 2021-07-20 DIAGNOSIS — Z1331 Encounter for screening for depression: Secondary | ICD-10-CM | POA: Diagnosis not present

## 2021-07-20 DIAGNOSIS — Z6830 Body mass index (BMI) 30.0-30.9, adult: Secondary | ICD-10-CM | POA: Diagnosis not present

## 2021-07-20 DIAGNOSIS — F419 Anxiety disorder, unspecified: Secondary | ICD-10-CM | POA: Diagnosis not present

## 2021-07-20 DIAGNOSIS — E669 Obesity, unspecified: Secondary | ICD-10-CM | POA: Diagnosis not present

## 2021-07-20 DIAGNOSIS — E1165 Type 2 diabetes mellitus with hyperglycemia: Secondary | ICD-10-CM | POA: Diagnosis not present

## 2021-07-20 DIAGNOSIS — I10 Essential (primary) hypertension: Secondary | ICD-10-CM | POA: Diagnosis not present

## 2021-07-20 DIAGNOSIS — E039 Hypothyroidism, unspecified: Secondary | ICD-10-CM | POA: Diagnosis not present

## 2021-07-20 DIAGNOSIS — E559 Vitamin D deficiency, unspecified: Secondary | ICD-10-CM | POA: Diagnosis not present

## 2021-07-22 DIAGNOSIS — E119 Type 2 diabetes mellitus without complications: Secondary | ICD-10-CM | POA: Diagnosis not present

## 2021-08-22 DIAGNOSIS — U071 COVID-19: Secondary | ICD-10-CM | POA: Diagnosis not present

## 2021-10-03 DIAGNOSIS — U071 COVID-19: Secondary | ICD-10-CM | POA: Diagnosis not present

## 2021-11-05 ENCOUNTER — Encounter: Payer: Self-pay | Admitting: Cardiology

## 2021-11-05 ENCOUNTER — Ambulatory Visit (INDEPENDENT_AMBULATORY_CARE_PROVIDER_SITE_OTHER): Payer: Medicare Other | Admitting: Cardiology

## 2021-11-05 VITALS — BP 130/80 | HR 60 | Ht 62.0 in | Wt 166.6 lb

## 2021-11-05 DIAGNOSIS — R011 Cardiac murmur, unspecified: Secondary | ICD-10-CM

## 2021-11-05 DIAGNOSIS — R0789 Other chest pain: Secondary | ICD-10-CM | POA: Diagnosis not present

## 2021-11-05 DIAGNOSIS — E782 Mixed hyperlipidemia: Secondary | ICD-10-CM | POA: Diagnosis not present

## 2021-11-05 DIAGNOSIS — I10 Essential (primary) hypertension: Secondary | ICD-10-CM | POA: Diagnosis not present

## 2021-11-05 DIAGNOSIS — I6529 Occlusion and stenosis of unspecified carotid artery: Secondary | ICD-10-CM | POA: Diagnosis not present

## 2021-11-05 NOTE — Patient Instructions (Signed)
Medication Instructions:  Your physician recommends that you continue on your current medications as directed. Please refer to the Current Medication list given to you today.   Labwork: None today  Testing/Procedures: Your physician has requested that you have an echocardiogram. Echocardiography is a painless test that uses sound waves to create images of your heart. It provides your doctor with information about the size and shape of your heart and how well your heart's chambers and valves are working. This procedure takes approximately one hour. There are no restrictions for this procedure.  Your physician has requested that you have a carotid duplex. This test is an ultrasound of the carotid arteries in your neck. It looks at blood flow through these arteries that supply the brain with blood. Allow one hour for this exam. There are no restrictions or special instructions.   Follow-Up: Follow up with Dr. Harl Bowie in 1 year  Any Other Special Instructions Will Be Listed Below (If Applicable).     If you need a refill on your cardiac medications before your next appointment, please call your pharmacy.

## 2021-11-05 NOTE — Progress Notes (Addendum)
Clinical Summary Ms. Schlafer is a 71 y.o.female seen today as a new patient. Last seen in our office in 2018   1. Chest pain -some right sided pains at times, occasional midchest. Aching like pain, better with tylenol. Usually notices when she wakes up. Can be little better with pressing area.    2. GERD - followed by Dr Laural Golden.      3. HTN - compliant with meds. She thinks she was on lisinopril in the past but stopped due to cough -compliantw ith meds   4. DM2 - followed by pcp - dizziness on metformin now off   5. Hyperlipidemia - reports side effects on multiple statins - believes she is on pravastatin '20mg'$  daily  6. Heart murmur - mild aortic valve thickening by 2016 ehco  7. Carotid stenosis 2019 mild bilateral disease.  - she is on ASA 81 mg daily, believes she is on pravastatin  Past Medical History:  Diagnosis Date   Diabetes mellitus without complication (HCC)    GERD (gastroesophageal reflux disease)    Hypertension    Neck pain    Vaginal dryness 12/14/2012     Allergies  Allergen Reactions   Lipitor [Atorvastatin] Other (See Comments)    Leg cramps     Current Outpatient Medications  Medication Sig Dispense Refill   ALPRAZolam (XANAX) 0.5 MG tablet Take 0.5 mg by mouth every 6 (six) hours as needed for anxiety.     amLODipine (NORVASC) 5 MG tablet Take 5 mg by mouth daily.     aspirin 81 MG tablet Take 81 mg by mouth daily.     esomeprazole (NEXIUM) 40 MG capsule Take 40 mg by mouth daily as needed (acid reflux).     glimepiride (AMARYL) 2 MG tablet Take 2 mg by mouth 2 (two) times daily.     hydrochlorothiazide (HYDRODIURIL) 25 MG tablet Take 25 mg by mouth daily.     Lidocaine 4 % PTCH Apply 1 patch topically 2 (two) times daily as needed (pain).     metoprolol succinate (TOPROL-XL) 50 MG 24 hr tablet Take 50 mg by mouth daily. Take with or immediately following a meal.     minocycline (MINOCIN) 100 MG capsule Take 100 mg by mouth 2 (two)  times daily.     OneTouch Delica Lancets 24M MISC 2 (two) times daily.     ONETOUCH ULTRA test strip 2 (two) times daily as needed.     pioglitazone (ACTOS) 45 MG tablet Take 45 mg by mouth daily.     No current facility-administered medications for this visit.     Past Surgical History:  Procedure Laterality Date   ABDOMINAL HYSTERECTOMY     CHOLECYSTECTOMY     Gallstones   COLONOSCOPY WITH PROPOFOL N/A 10/14/2020   Procedure: COLONOSCOPY WITH PROPOFOL;  Surgeon: Harvel Quale, MD;  [facility];  Service: Gastroenterology;  Laterality: N/A;  12:30 PM   ESOPHAGOGASTRODUODENOSCOPY N/A 07/03/2015   Procedure: ESOPHAGOGASTRODUODENOSCOPY (EGD);  Surgeon: Rogene Houston, MD;  Location: [facility];  Service: Endoscopy;  Laterality: N/A;  2:40 - moved to 1:45 - Ann to notify     Allergies  Allergen Reactions   Lipitor [Atorvastatin] Other (See Comments)    Leg cramps      Family History  Problem Relation Age of Onset   Hypertension Mother    Cancer Father        prostate   Cancer Paternal Aunt  breast   Diabetes Paternal Uncle      Social History Ms. Massengale reports that she has never smoked. She has never used smokeless tobacco. Ms. Buckingham reports no history of alcohol use.   Review of Systems CONSTITUTIONAL: No weight loss, fever, chills, weakness or fatigue.  HEENT: Eyes: No visual loss, blurred vision, double vision or yellow sclerae.No hearing loss, sneezing, congestion, runny nose or sore throat.  SKIN: No rash or itching.  CARDIOVASCULAR: per hpi RESPIRATORY: No shortness of breath, cough or sputum.  GASTROINTESTINAL: No anorexia, nausea, vomiting or diarrhea. No abdominal pain or blood.  GENITOURINARY: No burning on urination, no polyuria NEUROLOGICAL: No headache, dizziness, syncope, paralysis, ataxia, numbness or tingling in the extremities. No change in bowel or bladder control.  MUSCULOSKELETAL: No muscle, back pain, joint pain  or stiffness.  LYMPHATICS: No enlarged nodes. No history of splenectomy.  PSYCHIATRIC: No history of depression or anxiety.  ENDOCRINOLOGIC: No reports of sweating, cold or heat intolerance. No polyuria or polydipsia.  Marland Kitchen   Physical Examination Today's Vitals   11/05/21 0819  BP: 130/80  Pulse: 60  Weight: 166 lb 9.6 oz (75.6 kg)  Height: '5\' 2"'$  (1.575 m)   Body mass index is 30.47 kg/m.  Gen: resting comfortably, no acute distress HEENT: no scleral icterus, pupils equal round and reactive, no palptable cervical adenopathy,  CV: RRR, 3/6 systoilc murmur rusb, no jvd. +bilateral carotid bruits Resp: Clear to auscultation bilaterally GI: abdomen is soft, non-tender, non-distended, normal bowel sounds, no hepatosplenomegaly MSK: extremities are warm, no edema.  Skin: warm, no rash Neuro:  no focal deficits Psych: appropriate affect   Diagnostic Studies 05/2015 echo Study Conclusions   - Left ventricle: The cavity size was normal. Wall thickness was   increased in a pattern of mild LVH. Systolic function was normal.   The estimated ejection fraction was in the range of 60% to 65%.   Wall motion was normal; there were no regional wall motion   abnormalities. Doppler parameters are consistent with abnormal   left ventricular relaxation (grade 1 diastolic dysfunction). - Aortic valve: Mildly calcified annulus. Trileaflet; mildly   thickened leaflets. There was mild stenosis. Mean gradient (S): 8   mm Hg. Valve area (VTI): 1.82 cm^2. Valve area (Vmax): 1.64 cm^2. - Mitral valve: Mildly calcified annulus. Mildly thickened leaflets   . - Atrial septum: No defect or patent foramen ovale was identified. - Systemic veins: The IVC is small, suggesting low RA pressure and   hypovolemia. - Technically adequate study.      Assessment and Plan   1. . Chest pain - history of noncardiac chest pain. Prior descriptions suggestive of GI etiology, most recent pains more cosnistent with  MSK pain.  - no indication for ischemic testing.    2. HTN - she is at goal, continue current meds   3. Hyperlipidemia - side effects on statins in the past per report - she believes she is taking pravastatin '20mg'$  daiyl currently, we will confirm with pharmcy - request labs from pcp  4. Heart murmur - 2016 echo aortic sclerosis, louder murmur today and will repeat echo  5. Carotid stenosis - update [place]   Addendum: we did confirm with her pharmacy she is on pravastatin '20mg'$  daily.   F/u 1 year     Arnoldo Lenis, M.D.

## 2021-11-05 NOTE — Addendum Note (Signed)
Addended by: Christella Scheuermann C on: 11/05/2021 11:26 AM   Modules accepted: Orders

## 2021-11-18 ENCOUNTER — Ambulatory Visit (INDEPENDENT_AMBULATORY_CARE_PROVIDER_SITE_OTHER): Payer: Medicare Other

## 2021-11-18 DIAGNOSIS — I6529 Occlusion and stenosis of unspecified carotid artery: Secondary | ICD-10-CM

## 2021-11-18 DIAGNOSIS — I6523 Occlusion and stenosis of bilateral carotid arteries: Secondary | ICD-10-CM

## 2021-11-18 DIAGNOSIS — R011 Cardiac murmur, unspecified: Secondary | ICD-10-CM | POA: Diagnosis not present

## 2021-11-18 LAB — ECHOCARDIOGRAM COMPLETE
AR max vel: 0.99 cm2
AV Area VTI: 0.97 cm2
AV Area mean vel: 1 cm2
AV Mean grad: 14.8 mmHg
AV Peak grad: 27.1 mmHg
Ao pk vel: 2.6 m/s
Area-P 1/2: 2.96 cm2
Calc EF: 70.3 %
MV M vel: 3.19 m/s
MV Peak grad: 40.7 mmHg
S' Lateral: 2.38 cm
Single Plane A2C EF: 67 %
Single Plane A4C EF: 73.9 %

## 2021-12-25 DIAGNOSIS — Z6829 Body mass index (BMI) 29.0-29.9, adult: Secondary | ICD-10-CM | POA: Diagnosis not present

## 2021-12-25 DIAGNOSIS — I10 Essential (primary) hypertension: Secondary | ICD-10-CM | POA: Diagnosis not present

## 2021-12-25 DIAGNOSIS — F419 Anxiety disorder, unspecified: Secondary | ICD-10-CM | POA: Diagnosis not present

## 2021-12-25 DIAGNOSIS — E1165 Type 2 diabetes mellitus with hyperglycemia: Secondary | ICD-10-CM | POA: Diagnosis not present

## 2021-12-25 DIAGNOSIS — E7849 Other hyperlipidemia: Secondary | ICD-10-CM | POA: Diagnosis not present

## 2021-12-25 DIAGNOSIS — H6121 Impacted cerumen, right ear: Secondary | ICD-10-CM | POA: Diagnosis not present

## 2021-12-25 DIAGNOSIS — E063 Autoimmune thyroiditis: Secondary | ICD-10-CM | POA: Diagnosis not present

## 2021-12-25 DIAGNOSIS — E663 Overweight: Secondary | ICD-10-CM | POA: Diagnosis not present

## 2021-12-25 DIAGNOSIS — N952 Postmenopausal atrophic vaginitis: Secondary | ICD-10-CM | POA: Diagnosis not present

## 2022-04-05 DIAGNOSIS — Z23 Encounter for immunization: Secondary | ICD-10-CM | POA: Diagnosis not present

## 2022-04-06 DIAGNOSIS — J329 Chronic sinusitis, unspecified: Secondary | ICD-10-CM | POA: Diagnosis not present

## 2022-04-06 DIAGNOSIS — Z6829 Body mass index (BMI) 29.0-29.9, adult: Secondary | ICD-10-CM | POA: Diagnosis not present

## 2022-04-06 DIAGNOSIS — E538 Deficiency of other specified B group vitamins: Secondary | ICD-10-CM | POA: Diagnosis not present

## 2022-04-06 DIAGNOSIS — E119 Type 2 diabetes mellitus without complications: Secondary | ICD-10-CM | POA: Diagnosis not present

## 2022-04-06 DIAGNOSIS — I10 Essential (primary) hypertension: Secondary | ICD-10-CM | POA: Diagnosis not present

## 2022-04-06 DIAGNOSIS — K219 Gastro-esophageal reflux disease without esophagitis: Secondary | ICD-10-CM | POA: Diagnosis not present

## 2022-04-06 DIAGNOSIS — E663 Overweight: Secondary | ICD-10-CM | POA: Diagnosis not present

## 2022-06-01 DIAGNOSIS — E119 Type 2 diabetes mellitus without complications: Secondary | ICD-10-CM | POA: Diagnosis not present

## 2022-06-02 DIAGNOSIS — I10 Essential (primary) hypertension: Secondary | ICD-10-CM | POA: Diagnosis not present

## 2022-06-02 DIAGNOSIS — E538 Deficiency of other specified B group vitamins: Secondary | ICD-10-CM | POA: Diagnosis not present

## 2022-06-02 DIAGNOSIS — E1165 Type 2 diabetes mellitus with hyperglycemia: Secondary | ICD-10-CM | POA: Diagnosis not present

## 2022-06-02 DIAGNOSIS — Z6828 Body mass index (BMI) 28.0-28.9, adult: Secondary | ICD-10-CM | POA: Diagnosis not present

## 2022-06-02 DIAGNOSIS — K219 Gastro-esophageal reflux disease without esophagitis: Secondary | ICD-10-CM | POA: Diagnosis not present

## 2022-06-02 DIAGNOSIS — E663 Overweight: Secondary | ICD-10-CM | POA: Diagnosis not present

## 2022-07-22 ENCOUNTER — Telehealth: Payer: Self-pay | Admitting: *Deleted

## 2022-07-22 NOTE — Patient Outreach (Signed)
  Care Coordination   07/22/2022 Name: Lindsey Evans MRN: [id number] DOB: 1950/08/11   Care Coordination Outreach Attempts:  An unsuccessful telephone outreach was attempted today to offer the patient information about available care coordination services as a benefit of their health plan.   Follow Up Plan:  Additional outreach attempts will be made to offer the patient care coordination information and services.   Encounter Outcome:  No Answer   Care Coordination Interventions:  No, not indicated    SIG Maliki Gignac C. Myrtie Neither, MSN, [facility] Gerontological Nurse Practitioner [facility] Care Management [phone]

## 2022-08-10 ENCOUNTER — Telehealth: Payer: Self-pay | Admitting: *Deleted

## 2022-08-10 NOTE — Patient Outreach (Signed)
  Care Coordination   08/10/2022 Name: Lindsey Evans MRN: [id number] DOB: 21-Aug-1950   Care Coordination Outreach Attempts:  An unsuccessful telephone outreach was attempted today to offer the patient information about available care coordination services as a benefit of their health plan.   Follow Up Plan:  Additional outreach attempts will be made to offer the patient care coordination information and services.   Encounter Outcome:  No Answer   Care Coordination Interventions:  No, not indicated    SIG Lakaisha Danish C. Myrtie Neither, MSN, [facility] Gerontological Nurse Practitioner [facility] Care Management [phone]

## 2022-08-16 ENCOUNTER — Telehealth: Payer: Self-pay | Admitting: *Deleted

## 2022-08-16 NOTE — Patient Outreach (Addendum)
  Care Coordination   08/16/2022 Name: Lindsey Evans MRN: [id number] DOB: May 27, 1951   Care Coordination Outreach Attempts:  A third unsuccessful outreach was attempted today to offer the patient with information about available care coordination services as a benefit of their health plan.  NP also called daughter, Yael Shermer and left a message requesting a return call.  Follow Up Plan:  No further outreach attempts will be made at this time. We have been unable to contact the patient to offer or enroll patient in care coordination services  Encounter Outcome:  No Answer   Care Coordination Interventions:  No, not indicated    SIG Chamaine Stankus C. Myrtie Neither, MSN, [facility] Gerontological Nurse Practitioner [facility] Care Management [phone]

## 2022-09-03 DIAGNOSIS — K219 Gastro-esophageal reflux disease without esophagitis: Secondary | ICD-10-CM | POA: Diagnosis not present

## 2022-09-03 DIAGNOSIS — E559 Vitamin D deficiency, unspecified: Secondary | ICD-10-CM | POA: Diagnosis not present

## 2022-09-03 DIAGNOSIS — Z0001 Encounter for general adult medical examination with abnormal findings: Secondary | ICD-10-CM | POA: Diagnosis not present

## 2022-09-03 DIAGNOSIS — Z1331 Encounter for screening for depression: Secondary | ICD-10-CM | POA: Diagnosis not present

## 2022-09-03 DIAGNOSIS — E538 Deficiency of other specified B group vitamins: Secondary | ICD-10-CM | POA: Diagnosis not present

## 2022-09-03 DIAGNOSIS — Z6828 Body mass index (BMI) 28.0-28.9, adult: Secondary | ICD-10-CM | POA: Diagnosis not present

## 2022-09-03 DIAGNOSIS — E6609 Other obesity due to excess calories: Secondary | ICD-10-CM | POA: Diagnosis not present

## 2022-09-03 DIAGNOSIS — I10 Essential (primary) hypertension: Secondary | ICD-10-CM | POA: Diagnosis not present

## 2022-09-03 DIAGNOSIS — E1165 Type 2 diabetes mellitus with hyperglycemia: Secondary | ICD-10-CM | POA: Diagnosis not present

## 2022-09-03 DIAGNOSIS — E063 Autoimmune thyroiditis: Secondary | ICD-10-CM | POA: Diagnosis not present

## 2022-09-24 ENCOUNTER — Ambulatory Visit: Payer: Medicare Other | Attending: Cardiology | Admitting: Cardiology

## 2022-09-24 ENCOUNTER — Encounter: Payer: Self-pay | Admitting: Cardiology

## 2022-09-24 VITALS — BP 120/74 | HR 58 | Ht 62.0 in | Wt 160.0 lb

## 2022-09-24 DIAGNOSIS — I350 Nonrheumatic aortic (valve) stenosis: Secondary | ICD-10-CM | POA: Diagnosis not present

## 2022-09-24 DIAGNOSIS — R0789 Other chest pain: Secondary | ICD-10-CM | POA: Diagnosis not present

## 2022-09-24 DIAGNOSIS — I10 Essential (primary) hypertension: Secondary | ICD-10-CM | POA: Diagnosis not present

## 2022-09-24 DIAGNOSIS — E782 Mixed hyperlipidemia: Secondary | ICD-10-CM | POA: Insufficient documentation

## 2022-09-24 NOTE — Patient Instructions (Signed)
Medication Instructions:  Your physician recommends that you continue on your current medications as directed. Please refer to the Current Medication list given to you today.   Labwork: None today  Testing/Procedures: None today  Follow-Up: 1 year  Any Other Special Instructions Will Be Listed Below (If Applicable).  If you need a refill on your cardiac medications before your next appointment, please call your pharmacy.  

## 2022-09-24 NOTE — Progress Notes (Signed)
Clinical Summary Ms. Killoran is a 72 y.o.female  1. Chest pain -some right sided pains at times, occasional midchest. Aching like pain, better with tylenol. Usually notices when she wakes up. Can be little better with pressing area.   - episode of pain few days ago, another episode 2-3 weeks ago - episdoe 2-3 weeks ago laying in bed. Cramping like pain under pain, 1/10 in severity. No other associated symptoms. Not positional. Better with stretching. Pain lasted a few minutes. - second episode exact same in description.    2. GERD - followed by Dr Karilyn Cota.      3. HTN - compliant with meds   4. DM2 - followed by pcp - dizziness on metformin now off   5. Hyperlipidemia - reports side effects on multiple statins - she is on pravastatin 20mg  daily - pcp increased to 40mg  just recently, she had not started   6. Heart murmur - mild aortic valve thickening by 2016 ehco 10/2021 LVEF 60-65%, mild MR, mod TR, mild to mod AS   7. Carotid stenosis 2019 mild bilateral disease.  10/2021 mild bilateral by [place] - she is on ASA 81 mg daily, believes she is on pravastatin  Past Medical History:  Diagnosis Date   Diabetes mellitus without complication (HCC)    GERD (gastroesophageal reflux disease)    Hypertension    Neck pain    Vaginal dryness 12/14/2012     Allergies  Allergen Reactions   Lipitor [Atorvastatin] Other (See Comments)    Leg cramps     Current Outpatient Medications  Medication Sig Dispense Refill   ALPRAZolam (XANAX) 0.5 MG tablet Take 0.5 mg by mouth every 6 (six) hours as needed for anxiety.     amLODipine (NORVASC) 5 MG tablet Take 5 mg by mouth daily.     aspirin 81 MG tablet Take 81 mg by mouth daily.     esomeprazole (NEXIUM) 40 MG capsule Take 40 mg by mouth daily as needed (acid reflux).     glimepiride (AMARYL) 4 MG tablet Take 4 mg by mouth daily.     hydrochlorothiazide (HYDRODIURIL) 25 MG tablet Take 25 mg by mouth daily.     Lidocaine 4 %  PTCH Apply 1 patch topically 2 (two) times daily as needed (pain).     metoprolol succinate (TOPROL-XL) 50 MG 24 hr tablet Take 50 mg by mouth daily. Take with or immediately following a meal.     minocycline (MINOCIN) 100 MG capsule Take 100 mg by mouth 2 (two) times daily.     OneTouch Delica Lancets 30G MISC 2 (two) times daily.     ONETOUCH ULTRA test strip 2 (two) times daily as needed.     pioglitazone (ACTOS) 45 MG tablet Take 45 mg by mouth daily. (Patient not taking: Reported on 11/05/2021)     No current facility-administered medications for this visit.     Past Surgical History:  Procedure Laterality Date   ABDOMINAL HYSTERECTOMY     CHOLECYSTECTOMY     Gallstones   COLONOSCOPY WITH PROPOFOL N/A 10/14/2020   Procedure: COLONOSCOPY WITH PROPOFOL;  Surgeon: Dolores Frame, MD;  [facility]: [facility];  Service: Gastroenterology;  Laterality: N/A;  12:30 PM   ESOPHAGOGASTRODUODENOSCOPY N/A 07/03/2015   Procedure: ESOPHAGOGASTRODUODENOSCOPY (EGD);  Surgeon: Malissa Hippo, MD;  Location: [facility];  Service: Endoscopy;  Laterality: N/A;  2:40 - moved to 1:45 - Ann to notify     Allergies  Allergen  Reactions   Lipitor [Atorvastatin] Other (See Comments)    Leg cramps      Family History  Problem Relation Age of Onset   Hypertension Mother    Cancer Father        prostate   Cancer Paternal Aunt        breast   Diabetes Paternal Uncle      Social History Ms. Maisie Fushomas reports that she has never smoked. She has never used smokeless tobacco. Ms. Maisie Fushomas reports no history of alcohol use.   Review of Systems CONSTITUTIONAL: No weight loss, fever, chills, weakness or fatigue.  HEENT: Eyes: No visual loss, blurred vision, double vision or yellow sclerae.No hearing loss, sneezing, congestion, runny nose or sore throat.  SKIN: No rash or itching.  CARDIOVASCULAR: per hpi RESPIRATORY: No shortness of breath, cough or sputum.  GASTROINTESTINAL: No anorexia,  nausea, vomiting or diarrhea. No abdominal pain or blood.  GENITOURINARY: No burning on urination, no polyuria NEUROLOGICAL: No headache, dizziness, syncope, paralysis, ataxia, numbness or tingling in the extremities. No change in bowel or bladder control.  MUSCULOSKELETAL: No muscle, back pain, joint pain or stiffness.  LYMPHATICS: No enlarged nodes. No history of splenectomy.  PSYCHIATRIC: No history of depression or anxiety.  ENDOCRINOLOGIC: No reports of sweating, cold or heat intolerance. No polyuria or polydipsia.  Marland Kitchen.   Physical Examination Today's Vitals   09/24/22 1037  BP: 120/74  Pulse: (!) 58  SpO2: 99%  Weight: 160 lb (72.6 kg)  Height: 5\' 2"  (1.575 m)   Body mass index is 29.26 kg/m.  Gen: resting comfortably, no acute distress HEENT: no scleral icterus, pupils equal round and reactive, no palptable cervical adenopathy,  CV: RRR, 2/6 systolic murmur rusb and apex Resp: Clear to auscultation bilaterally GI: abdomen is soft, non-tender, non-distended, normal bowel sounds, no hepatosplenomegaly MSK: extremities are warm, no edema.  Skin: warm, no rash Neuro:  no focal deficits Psych: appropriate affect   Diagnostic Studies  05/2015 echo Study Conclusions   - Left ventricle: The cavity size was normal. Wall thickness was   increased in a pattern of mild LVH. Systolic function was normal.   The estimated ejection fraction was in the range of 60% to 65%.   Wall motion was normal; there were no regional wall motion   abnormalities. Doppler parameters are consistent with abnormal   left ventricular relaxation (grade 1 diastolic dysfunction). - Aortic valve: Mildly calcified annulus. Trileaflet; mildly   thickened leaflets. There was mild stenosis. Mean gradient (S): 8   mm Hg. Valve area (VTI): 1.82 cm^2. Valve area (Vmax): 1.64 cm^2. - Mitral valve: Mildly calcified annulus. Mildly thickened leaflets   . - Atrial septum: No defect or patent foramen ovale was  identified. - Systemic veins: The IVC is small, suggesting low RA pressure and   hypovolemia. - Technically adequate study.   Assessment and Plan   1. . Chest pain - history of noncardiac chest pain.Recent symptoms not consistent with cardiac etiology - continue to monitor - EKG SR, no ischemic changes   2. HTN - at goal, continue current meds   3. Hyperlipidemia - side effects on statins in the past per report - tolerating pravastatin. Rcent LDL above goal, pcp has already increased pravastatin to 40mg  daily.    4. Valvular heart disease - mild MR, mod TR, mild to mod AS - no symptoms - continue to monitor, repeat echo 2025   5. Carotid stenosis - mild stenosis by US, repeat US 2025  F/u 1 year   Antoine Poche, M.D.

## 2022-12-27 ENCOUNTER — Ambulatory Visit (INDEPENDENT_AMBULATORY_CARE_PROVIDER_SITE_OTHER): Payer: Medicare Other | Admitting: Orthopedic Surgery

## 2022-12-27 DIAGNOSIS — M65331 Trigger finger, right middle finger: Secondary | ICD-10-CM

## 2022-12-27 MED ORDER — METHYLPREDNISOLONE ACETATE 40 MG/ML IJ SUSP: 40.0000 mg | Freq: Once | INTRAMUSCULAR | Status: DC

## 2022-12-27 NOTE — Progress Notes (Signed)
Chief Complaint  Patient presents with   Hand Pain    Right middle long finger triggering    Recurrent pain and triggering of the right long/middle finger started in 2019 she has had several injections  She would like another injection although I did offer surgery  Tenderness is noted over the A1 pulley she has some popping on extension from a flexed position  Trigger finger injection  Diagnosis tenosynovitis right long/middle finger Procedure injection A1 pulley Medications lidocaine 1% 1 mL and Depo-Medrol 40 mg 1 mL Skin prep alcohol and ethyl chloride Verbal consent was obtained Timeout confirmed the injection site  After cleaning the skin with alcohol and anesthetizing the skin with ethyl chloride the A1 pulley was palpated and the injection was performed without complication  Return as needed

## 2022-12-27 NOTE — Addendum Note (Signed)
Addended by: Michaele Offer on: 12/27/2022 11:35 AM   Modules accepted: Orders

## 2023-02-03 ENCOUNTER — Telehealth: Payer: Self-pay | Admitting: *Deleted

## 2023-02-03 NOTE — Progress Notes (Signed)
  Care Coordination   Note   02/03/2023 Name: Lindsey Evans MRN: [id number] DOB: Oct 28, 1950  Lindsey Evans is a 72 y.o. year old female who sees Elfredia Nevins, MD for primary care. I reached out to Christie Nottingham by phone today to offer care coordination services.  [name] was given information about Care Coordination services today including:   The Care Coordination services include support from the care team which includes your Nurse Coordinator, Clinical Social Worker, or Pharmacist.  The Care Coordination team is here to help remove barriers to the health concerns and goals most important to you. Care Coordination services are voluntary, and the patient may decline or stop services at any time by request to their care team member.   Care Coordination Consent Status: Patient wishes to consider information provided and/or speak with a member of the care team before deciding to participate in care coordination services.   Pt declined a f/u call and given contact info if services needed in the future   Encounter Outcome:  Pt. Request to Call Back  Burman Nieves, [facility] Care Coordination Care Guide Direct Dial: [phone]

## 2023-02-03 NOTE — Progress Notes (Signed)
  Care Coordination  Outreach Note  02/03/2023 Name: Lindsey Evans MRN: [id number] DOB: 11/06/1950   Care Coordination Outreach Attempts: An unsuccessful telephone outreach was attempted today to offer the patient information about available care coordination services.  Follow Up Plan:  Additional outreach attempts will be made to offer the patient care coordination information and services.   Encounter Outcome:  No Answer  Burman Nieves, CCMA Care Coordination Care Guide Direct Dial: [phone]

## 2023-04-07 DIAGNOSIS — E6609 Other obesity due to excess calories: Secondary | ICD-10-CM | POA: Diagnosis not present

## 2023-04-07 DIAGNOSIS — I10 Essential (primary) hypertension: Secondary | ICD-10-CM | POA: Diagnosis not present

## 2023-04-07 DIAGNOSIS — H6121 Impacted cerumen, right ear: Secondary | ICD-10-CM | POA: Diagnosis not present

## 2023-04-07 DIAGNOSIS — Z6828 Body mass index (BMI) 28.0-28.9, adult: Secondary | ICD-10-CM | POA: Diagnosis not present

## 2023-04-07 DIAGNOSIS — E538 Deficiency of other specified B group vitamins: Secondary | ICD-10-CM | POA: Diagnosis not present

## 2023-04-07 DIAGNOSIS — E1165 Type 2 diabetes mellitus with hyperglycemia: Secondary | ICD-10-CM | POA: Diagnosis not present

## 2023-04-11 DIAGNOSIS — Z23 Encounter for immunization: Secondary | ICD-10-CM | POA: Diagnosis not present

## 2023-06-06 DIAGNOSIS — E119 Type 2 diabetes mellitus without complications: Secondary | ICD-10-CM | POA: Diagnosis not present

## 2023-06-09 ENCOUNTER — Ambulatory Visit: Payer: Medicare Other | Admitting: Orthopedic Surgery

## 2023-06-09 ENCOUNTER — Encounter: Payer: Self-pay | Admitting: Orthopedic Surgery

## 2023-06-09 DIAGNOSIS — G5601 Carpal tunnel syndrome, right upper limb: Secondary | ICD-10-CM

## 2023-06-09 DIAGNOSIS — M65331 Trigger finger, right middle finger: Secondary | ICD-10-CM | POA: Diagnosis not present

## 2023-06-09 MED ORDER — METHYLPREDNISOLONE ACETATE 40 MG/ML IJ SUSP
40.0000 mg | Freq: Once | INTRAMUSCULAR | Status: AC
  Administered 2023-06-09: 40 mg via INTRA_ARTICULAR

## 2023-06-09 NOTE — Progress Notes (Signed)
    Chief complaint joint pain  Encounter Diagnosis  Name Primary?   Trigger finger, right middle finger Yes     The patient has requested or agreed to an injection in the right long finger  Medication: Depo-Medrol 40 mg and 1% lidocaine 3 cc  Injection site:  A1 pulley right long finger  Technique:  Verbal consent was obtained to inject the right long finger  The site was confirmed by timeout: Yes  The site was cleaned with alcohol and sprayed with ethyl chloride  The injection was performed without complication and a Band-Aid was applied to the area  Appropriate precautions and instructions were given.   New complaints of tingling tips of the fingers about 2 months.  The patient tried to wear a sock and put [place] on the hand and some of the pain went away but the tingling did not.  It is now an intermittent symptom that seems to come and go  Signs and symptoms consistent with carpal tunnel syndrome without positive Phalen's or compression test  Carpal tunnel splint  Return as needed

## 2023-09-06 ENCOUNTER — Telehealth: Payer: Self-pay

## 2023-09-06 NOTE — Telephone Encounter (Signed)
 Patient left message on 3/17 for [place] to call her. I returned her call and had to leave a message for her to  call the office.

## 2023-09-23 ENCOUNTER — Encounter (HOSPITAL_COMMUNITY): Payer: Self-pay

## 2023-09-23 ENCOUNTER — Emergency Department (HOSPITAL_COMMUNITY)
Admission: EM | Admit: 2023-09-23 | Discharge: 2023-09-24 | Disposition: A | Discharge status: Home / Self Care | Attending: Emergency Medicine | Admitting: Emergency Medicine

## 2023-09-23 ENCOUNTER — Other Ambulatory Visit: Payer: Self-pay

## 2023-09-23 DIAGNOSIS — Z7984 Long term (current) use of oral hypoglycemic drugs: Secondary | ICD-10-CM | POA: Diagnosis not present

## 2023-09-23 DIAGNOSIS — Z7982 Long term (current) use of aspirin: Secondary | ICD-10-CM | POA: Diagnosis not present

## 2023-09-23 DIAGNOSIS — Z79899 Other long term (current) drug therapy: Secondary | ICD-10-CM | POA: Diagnosis not present

## 2023-09-23 DIAGNOSIS — E119 Type 2 diabetes mellitus without complications: Secondary | ICD-10-CM | POA: Insufficient documentation

## 2023-09-23 DIAGNOSIS — I10 Essential (primary) hypertension: Secondary | ICD-10-CM | POA: Diagnosis not present

## 2023-09-23 DIAGNOSIS — K2900 Acute gastritis without bleeding: Secondary | ICD-10-CM | POA: Insufficient documentation

## 2023-09-23 DIAGNOSIS — R1013 Epigastric pain: Secondary | ICD-10-CM | POA: Diagnosis present

## 2023-09-23 LAB — CBC
HCT: 47.5 % — ABNORMAL HIGH (ref 36.0–46.0)
Hemoglobin: 15.3 g/dL — ABNORMAL HIGH (ref 12.0–15.0)
MCH: 27 pg (ref 26.0–34.0)
MCHC: 32.2 g/dL (ref 30.0–36.0)
MCV: 83.8 fL (ref 80.0–100.0)
Platelets: 322 10*3/uL (ref 150–400)
RBC: 5.67 MIL/uL — ABNORMAL HIGH (ref 3.87–5.11)
RDW: 14.5 % (ref 11.5–15.5)
WBC: 10.4 10*3/uL (ref 4.0–10.5)
nRBC: 0 % (ref 0.0–0.2)

## 2023-09-23 LAB — COMPREHENSIVE METABOLIC PANEL WITH GFR
ALT: 16 U/L (ref 0–44)
AST: 17 U/L (ref 15–41)
Albumin: 3.8 g/dL (ref 3.5–5.0)
Alkaline Phosphatase: 46 U/L (ref 38–126)
Anion gap: 11 (ref 5–15)
BUN: 12 mg/dL (ref 8–23)
CO2: 21 mmol/L — ABNORMAL LOW (ref 22–32)
Calcium: 9.3 mg/dL (ref 8.9–10.3)
Chloride: 102 mmol/L (ref 98–111)
Creatinine, Ser: 0.95 mg/dL (ref 0.44–1.00)
GFR, Estimated: >60 mL/min (ref >60)
Glucose, Bld: 174 mg/dL — ABNORMAL HIGH (ref 70–99)
Potassium: 3.4 mmol/L — ABNORMAL LOW (ref 3.5–5.1)
Sodium: 134 mmol/L — ABNORMAL LOW (ref 135–145)
Total Bilirubin: 0.7 mg/dL (ref 0.0–1.2)
Total Protein: 7.6 g/dL (ref 6.5–8.1)

## 2023-09-23 LAB — URINALYSIS, ROUTINE W REFLEX MICROSCOPIC
Bilirubin Urine: NEGATIVE
Glucose, UA: 50 mg/dL — AB
Hgb urine dipstick: NEGATIVE
Ketones, ur: NEGATIVE mg/dL
Leukocytes,Ua: NEGATIVE
Nitrite: NEGATIVE
Protein, ur: 100 mg/dL — AB
Specific Gravity, Urine: 1.016 (ref 1.005–1.030)
pH: 5 (ref 5.0–8.0)

## 2023-09-23 LAB — LIPASE, BLOOD: Lipase: 27 U/L (ref 11–51)

## 2023-09-23 MED ORDER — ONDANSETRON HCL 4 MG/2ML IJ SOLN
4.0000 mg | Freq: Once | INTRAMUSCULAR | Status: AC
  Administered 2023-09-24: 4 mg via INTRAVENOUS
  Filled 2023-09-23: qty 2

## 2023-09-23 MED ORDER — PANTOPRAZOLE SODIUM 40 MG IV SOLR
40.0000 mg | Freq: Once | INTRAVENOUS | Status: AC
  Administered 2023-09-24: 40 mg via INTRAVENOUS
  Filled 2023-09-23: qty 10

## 2023-09-23 MED ORDER — MORPHINE SULFATE (PF) 4 MG/ML IV SOLN
4.0000 mg | Freq: Once | INTRAVENOUS | Status: AC
  Administered 2023-09-24: 4 mg via INTRAVENOUS
  Filled 2023-09-23: qty 1

## 2023-09-23 MED ORDER — LACTATED RINGERS IV BOLUS
1000.0000 mL | Freq: Once | INTRAVENOUS | Status: AC
  Administered 2023-09-24: 1000 mL via INTRAVENOUS

## 2023-09-23 NOTE — ED Provider Notes (Signed)
 [place] [facility] AT [facility] Provider Note   CSN: [id number] Arrival date & time: 09/23/23  2157     History  Chief Complaint  Patient presents with   Abdominal Pain    Lindsey Evans is a 73 y.o. female with PMH as listed below who presents Pov from home. Cc of 7 out of 10 intermittent abdominal pain that started yesterday morning. Went to dentist and was given antibiotics for dental abscess. Says symptoms started after she started the antibiotics.  Her first dose was on Wednesday and the symptoms began Wednesday evening.  She took only 2 doses of the antibiotics, Wednesday evening and Thursday morning prior to discontinuing them because of the symptoms. C/o diarrhea (8x today) and nausea.  Diarrhea is soft but not watery, not particularly foul-smelling, and she has not had any diarrhea since 6 PM tonight.  She has not had any vomiting.  Denies hematochezia or melena.  Has history of cholecystectomy remotely.  Never has had any abdominal pain similar to this.  It is intermittent and located in the epigastric region.  Denies fever/chills, urinary symptoms, vaginal symptoms.  Endorses sneezing yesterday.  No sick contacts.  Symptoms feel almost like indigestion and she also states she is a history of reflux.  Pain does not radiate.  Past Medical History:  Diagnosis Date   Diabetes mellitus without complication (HCC)    GERD (gastroesophageal reflux disease)    Hypertension    Neck pain    Vaginal dryness 12/14/2012       Home Medications Prior to Admission medications   Medication Sig Start Date End Date Taking? Authorizing Provider  omeprazole (PRILOSEC) 40 MG capsule Take 1 capsule (40 mg total) by mouth daily. 09/24/23 11/23/23 Yes Loetta Rough, MD  ondansetron (ZOFRAN-ODT) 4 MG disintegrating tablet Take 1 tablet (4 mg total) by mouth every 8 (eight) hours as needed for nausea or vomiting. 09/24/23  Yes Loetta Rough, MD  ALPRAZolam Prudy Feeler) 0.5 MG  tablet Take 0.5 mg by mouth every 6 (six) hours as needed for anxiety. 12/20/13   [provider]  amLODipine (NORVASC) 5 MG tablet Take 5 mg by mouth daily.    [provider]  aspirin 81 MG tablet Take 81 mg by mouth daily.    [provider]  glimepiride (AMARYL) 4 MG tablet Take 4 mg by mouth daily. 10/29/21   [provider]  hydrochlorothiazide (HYDRODIURIL) 25 MG tablet Take 25 mg by mouth daily.    [provider]  Lidocaine 4 % PTCH Apply 1 patch topically 2 (two) times daily as needed (pain).    [provider]  metoprolol succinate (TOPROL-XL) 50 MG 24 hr tablet Take 50 mg by mouth daily. Take with or immediately following a meal.    [provider]  minocycline (MINOCIN) 100 MG capsule Take 100 mg by mouth 2 (two) times daily. 01/06/21   [provider]  OneTouch Delica Lancets 30G MISC 2 (two) times daily. 11/25/20   [provider]  [facility] ULTRA test strip 2 (two) times daily as needed. 12/28/20   [provider]  pravastatin (PRAVACHOL) 40 MG tablet Take 40 mg by mouth daily.    [provider]      Allergies    Lipitor [atorvastatin]    Review of Systems   Review of Systems A 10 point review of systems was performed and is negative unless otherwise reported in HPI.  Physical Exam Updated Vital  Signs BP (!) 150/82   Pulse 61   Temp 98.8 F (37.1 C) (Oral)   Resp 19   Ht 5\' 2"  (1.575 m)   Wt 71.2 kg   SpO2 97%   BMI 28.72 kg/m  Physical Exam General: Normal appearing female, lying in bed.  HEENT: PERRLA, Sclera anicteric, MMM, trachea midline.  Cardiology: RRR,  Resp: Normal respiratory rate and effort. CTAB, no wheezes, rhonchi, crackles.  Abd: Soft, +TTP mildly in epigastric region, non-distended. No rebound tenderness or guarding.  GU: Deferred. MSK: No peripheral edema or signs of trauma. Extremities without deformity or TTP. No cyanosis or clubbing. Skin: warm, dry.   Back: No CVA tenderness Neuro: A&Ox4, CNs II-XII grossly intact. MAEs. Sensation grossly intact.  Psych: Normal mood and affect.   ED Results / Procedures / Treatments   Labs (all labs ordered are listed, but only abnormal results are displayed) Labs Reviewed  COMPREHENSIVE METABOLIC PANEL WITH GFR - Abnormal; Notable for the following components:      Result Value   Sodium 134 (*)    Potassium 3.4 (*)    CO2 21 (*)    Glucose, Bld 174 (*)    All other components within normal limits  CBC - Abnormal; Notable for the following components:   RBC 5.67 (*)    Hemoglobin 15.3 (*)    HCT 47.5 (*)    All other components within normal limits  URINALYSIS, ROUTINE W REFLEX MICROSCOPIC - Abnormal; Notable for the following components:   APPearance HAZY (*)    Glucose, UA 50 (*)    Protein, ur 100 (*)    Bacteria, UA RARE (*)    All other components within normal limits  RESP PANEL BY RT-PCR (RSV, FLU A&B, COVID)  RVPGX2  LIPASE, BLOOD  TROPONIN I (HIGH SENSITIVITY)    EKG EKG Interpretation Date/Time:  Friday September 23 2023 23:35:16 EDT Ventricular Rate:  64 PR Interval:  132 QRS Duration:  77 QT Interval:  422 QTC Calculation: 436 R Axis:   -10  Text Interpretation: Sinus rhythm Abnormal R-wave progression, early transition Left ventricular hypertrophy No prior for comparison Confirmed by Vivi Barrack [phone]) on 09/24/2023 12:02:46 AM  Radiology No results found.  Procedures Procedures    Medications Ordered in ED Medications  morphine (PF) 4 MG/ML injection 4 mg (4 mg Intravenous Given 09/24/23 0004)  ondansetron (ZOFRAN) injection 4 mg (4 mg Intravenous Given 09/24/23 0004)  pantoprazole (PROTONIX) injection 40 mg (40 mg Intravenous Given 09/24/23 0004)  lactated ringers bolus 1,000 mL (0 mLs Intravenous Stopped 09/24/23 0201)  iohexol (OMNIPAQUE) 300 MG/ML solution 100 mL (100 mLs Intravenous Contrast Given 09/24/23 0205)  morphine (PF) 4 MG/ML injection 2 mg (2 mg  Intravenous Given 09/24/23 0224)    ED Course/ Medical Decision Making/ A&P                          Medical Decision Making Amount and/or Complexity of Data Reviewed Labs: ordered. Decision-making details documented in ED Course. Radiology: ordered. Decision-making details documented in ED Course.  Risk Prescription drug management.    This patient presents to the ED for concern of abdominal pain, N/V/D, this involves an extensive number of treatment options, and is a complaint that carries with it a high risk of complications and morbidity.  I considered the following differential and admission for this acute, potentially life threatening condition.   MDM:    For DDX for abdominal pain  includes but is not limited to:  Abdominal exam without peritoneal signs. No evidence of acute abdomen at this time. Low suspicion for acute pancreatitis (neg lipase), PUD (including gastric perforation),  acute appendicitis, vascular catastrophe, bowel obstruction, diverticulitis.  Consider possible side effect from antibiotics, most likely amoxicillin.  She has had several episodes of diarrhea but has not had any since 6 PM today, it is soft but not watery or foul-smelling, lower concern for C. difficile.   Clinical Course as of 09/28/23 0516  Fri Sep 23, 2023  2303 WBC: 10.4 No leukocytosis  [HN]  2303 Hemoglobin(!): 15.3 Likely volume contraction [HN]  2326 Lipase: 27 neg [HN]  2326 Comprehensive metabolic panel(!) Largely unremarkable [HN]  Sat Sep 24, 2023  0002 Troponin I (High Sensitivity): 6 neg [HN]  0056 Resp panel by RT-PCR (RSV, Flu A&B, Covid) Anterior Nasal Swab neg [HN]  0145 Patient reevaluated and had continued pain despite pain meds, fluids, nausea meds. Her nausea resolved. Will CT. [HN]  0316 CT ABDOMEN PELVIS W CONTRAST 1. Marked severity asymmetric hypodense gastric wall thickening, as described above, which may be secondary to gastritis. Further evaluation with  endoscopy is recommended to exclude the presence of an underlying neoplasm. 2. Colonic diverticulosis. 3. Evidence of prior cholecystectomy and hysterectomy. 4. Aortic atherosclerosis.   [HN]    Clinical Course User Index [HN] Loetta Rough, MD    Patient ultimately found to have gastritis. Advised to take omeprazole twice per day. Patient states she doesn't smoke/drink. Doesn't take NSAIDs except her daily aspirin. Advised to f/u with her GI doctor within 1-2 weeks for an endoscopy and discuss further treatment options as well as the risk/benefit of continuing on the daily aspirin. Patient is improved in her symptoms and states she is ready to be discharged. Given prilosec as well as zofran ODT rx and given extensive return precautions.   Labs: I Ordered, and personally interpreted labs.  The pertinent results include: Those listed above  Imaging Studies ordered: I ordered imaging studies including CT A/P I independently visualized and interpreted imaging. I agree with the radiologist interpretation  Additional history obtained from chart review, daughter at bedside.    Cardiac Monitoring: The patient was maintained on a cardiac monitor.  I personally viewed and interpreted the cardiac monitored which showed an underlying rhythm of: Normal sinus rhythm  Reevaluation: After the interventions noted above, I reevaluated the patient and found that they have :improved  Social Determinants of Health:  lives independently  Disposition:  DC w/ discharge instructions/return precautions. All questions answered to patient's satisfaction.    Co morbidities that complicate the patient evaluation  Past Medical History:  Diagnosis Date   Diabetes mellitus without complication (HCC)    GERD (gastroesophageal reflux disease)    Hypertension    Neck pain    Vaginal dryness 12/14/2012     Medicines Meds ordered this encounter  Medications   morphine (PF) 4 MG/ML injection 4 mg     Refill:  0   ondansetron (ZOFRAN) injection 4 mg   pantoprazole (PROTONIX) injection 40 mg   lactated ringers bolus 1,000 mL   iohexol (OMNIPAQUE) 300 MG/ML solution 100 mL   morphine (PF) 4 MG/ML injection 2 mg    Refill:  0   ondansetron (ZOFRAN-ODT) 4 MG disintegrating tablet    Sig: Take 1 tablet (4 mg total) by mouth every 8 (eight) hours as needed for nausea or vomiting.    Dispense:  20 tablet  Refill:  0   omeprazole (PRILOSEC) 40 MG capsule    Sig: Take 1 capsule (40 mg total) by mouth daily.    Dispense:  30 capsule    Refill:  1    I have reviewed the patients home medicines and have made adjustments as needed  Problem List / ED Course: Problem List Items Addressed This Visit   None Visit Diagnoses       Acute gastritis without hemorrhage, unspecified gastritis type    -  Primary                   This note was created using dictation software, which may contain spelling or grammatical errors.    Loetta Rough, MD 09/28/23 [phone]

## 2023-09-23 NOTE — ED Triage Notes (Signed)
 Pov from home. Cc of abdominal pain that started yesterday morning. Went to dentist and was given antibiotics for dental abscess. Says symptoms started after she started the antibiotics (2 doses so far)  says her stomach makes gurgling sounds.  C/o diarrhea (8x today) and nausea.  7/10

## 2023-09-24 ENCOUNTER — Emergency Department (HOSPITAL_COMMUNITY)

## 2023-09-24 DIAGNOSIS — K2900 Acute gastritis without bleeding: Secondary | ICD-10-CM | POA: Diagnosis not present

## 2023-09-24 LAB — RESP PANEL BY RT-PCR (RSV, FLU A&B, COVID)  RVPGX2
Influenza A by PCR: NEGATIVE
Influenza B by PCR: NEGATIVE
Resp Syncytial Virus by PCR: NEGATIVE
SARS Coronavirus 2 by RT PCR: NEGATIVE

## 2023-09-24 LAB — TROPONIN I (HIGH SENSITIVITY): Troponin I (High Sensitivity): 6 ng/L (ref <18)

## 2023-09-24 MED ORDER — MORPHINE SULFATE (PF) 4 MG/ML IV SOLN
2.0000 mg | Freq: Once | INTRAVENOUS | Status: AC
  Administered 2023-09-24: 2 mg via INTRAVENOUS
  Filled 2023-09-24: qty 1

## 2023-09-24 MED ORDER — IOHEXOL 300 MG/ML  SOLN
100.0000 mL | Freq: Once | INTRAMUSCULAR | Status: AC | PRN
  Administered 2023-09-24: 100 mL via INTRAVENOUS

## 2023-09-24 MED ORDER — OMEPRAZOLE 40 MG PO CPDR: 40.0000 mg | DELAYED_RELEASE_CAPSULE | Freq: Every day | ORAL | 1 refills | Status: AC

## 2023-09-24 MED ORDER — ONDANSETRON 4 MG PO TBDP: 4.0000 mg | ORAL_TABLET | Freq: Three times a day (TID) | ORAL | 0 refills | Status: AC | PRN

## 2023-09-24 NOTE — ED Notes (Signed)
 Pt back from CT

## 2023-09-24 NOTE — Discharge Instructions (Addendum)
 Thank you for coming to [facility] Emergency Department. You were seen for abdominal pain, nausea/vomiting. We did an exam, labs, and imaging, and these showed inflammation of the lining of the stomach called gastritis. Please take omeprazole 40 mg each day. We recommend follow up with a gastroenterologist for an endoscopy. Please call to schedule this. You can also take zofran 4 mg under the tongue as needed for nausea/vomiting.   Please avoid NSAIDs such as ibuprofen, aspirin, naproxen. Please avoid alcohol as well. You can take tylenol 1,000 mg every 8 hours for pain.  Please follow up with your primary care provider within 1 week.   Do not hesitate to return to the ED or call 911 if you experience: -Worsening symptoms -Lightheadedness, passing out -Fevers/chills -Anything else that concerns you

## 2023-09-24 NOTE — ED Notes (Signed)
 Pt gone to CT

## 2023-09-26 NOTE — H&P (View-Only) (Signed)
 Referring Provider: Kathyleen Parkins, MD Primary Care Physician:  Kathyleen Parkins, MD Primary GI Physician: Dr. Sammi Crick  Chief Complaint  Patient presents with   Abdominal Pain    Upper stomach pains, nausea and diarrhea      HPI:   Lindsey Evans is a 73 y.o. female with medical history of diabetes, HTN, GERD, presenting today with chief complaint of nausea, abdominal pain, diarrhea.   She was seen in the emergency room 09/23/2023.  She presented with new onset 7 out of 10 intermittent epigastric abdominal pain that started the morning prior.  She has been to the dentist and was prescribed antibiotics for dental abscess and stated symptoms started after taking antibiotics.  She had only taken 2 doses.  Also reporting diarrhea and nausea.  Laboratory evaluation remarkable for hemoglobin 15.3, sodium 134, potassium 3.4, glucose 174.  Troponin within normal limits.  Respiratory panel negative.  Lipase, LFTs normal.  CT A/P with contrast showing marked severity asymmetric hypodense gastric wall thickening which may be secondary to gastritis, the recommended endoscopy to exclude neoplasm.  She was given analgesics, antiemetics, IV fluids, and pantoprazole  in the ER.  She was discharged on omeprazole  40 mg daily, Zofran  as needed, and recommended follow-up with GI and PCP.   Today:  Went to Dentist on Wednesday and was prescribed doxycycline. Took medication on empty stomach Thursday and that's when she started having abdominal pain, nausea, and diarrhea.  Diarrhea resolved Thursday evening, but abdominal pain and nausea lasted all day into Friday and went to the ER.   Since starting omeprazole  40 mg daily for the ER, she has been feeling just fine. No abdominal pain, nausea, vomiting, GERD symptoms. States if she gets just a slight tinge of discomfort, she can eat something and it will go away.   Prior to this, she had been taking esomeprazole 40 mg as needed for sensation of food getting  stuck in her chest.  States medication would help instantly.  No vomiting or regurgitation of food.  States this has been an intermittent symptom for years.  Symptoms did improve with prior esophageal dilation in 2017 and has symptoms very rarely.  States last episode was about 3 months ago.  States t it can occur with cabbage, hot dogs, or drinking water  she fast.    No brbpr or melena.  No unintentional weight loss.  Had been trying to lose weight.    NSAIDs:  81 mg aspirin  Occasional advil  for gum swelling. Less than once a week.    Last colonoscopy 10/14/2020: Diverticulosis in the sigmoid, descending, ascending colon.  Otherwise normal exam.  Recommended 10-year screening.  Last EGD 07/03/2015: Wavy GE junction with single patch of salmon colored mucosa greater than 10 mm in length. Biopsy taken from this area post dilation.  No evidence of esophageal stricture or erosions.  Fine nodularity noted to fundal mucosa. Biopsies taken.  Esophageal dilation with 54 French Maloney dilator resulting in linear mucosal disruption  at cervical esophagus indicative of disrupted web. Pathology showed mild chronic gastritis, no H. pylori, reflux changes in the esophagus, negative for Barrett's.  Past Medical History:  Diagnosis Date   Aortic stenosis    Diabetes mellitus without complication (HCC)    GERD (gastroesophageal reflux disease)    Hypertension    Neck pain    Vaginal dryness 12/14/2012    Past Surgical History:  Procedure Laterality Date   ABDOMINAL HYSTERECTOMY     CHOLECYSTECTOMY  Gallstones   COLONOSCOPY WITH PROPOFOL  N/A 10/14/2020   Procedure: COLONOSCOPY WITH PROPOFOL ;  Surgeon: Urban Garden, MD;  Location: [facility];  Service: Gastroenterology;  Laterality: N/A;  12:30 PM   ESOPHAGOGASTRODUODENOSCOPY N/A 07/03/2015   Procedure: ESOPHAGOGASTRODUODENOSCOPY (EGD);  Surgeon: Ruby Corporal, MD;  Location: [facility];  Service: Endoscopy;  Laterality:  N/A;  2:40 - moved to 1:45 - Ann to notify    Current Outpatient Medications  Medication Sig Dispense Refill   ALPRAZolam (XANAX) 0.5 MG tablet Take 0.5 mg by mouth every 6 (six) hours as needed for anxiety.     amLODipine (NORVASC) 5 MG tablet Take 5 mg by mouth daily.     aspirin 81 MG tablet Take 81 mg by mouth daily.     glimepiride (AMARYL) 4 MG tablet Take 4 mg by mouth daily.     hydrochlorothiazide (HYDRODIURIL) 25 MG tablet Take 25 mg by mouth daily.     Lidocaine 4 % PTCH Apply 1 patch topically 2 (two) times daily as needed (pain).     metoprolol succinate (TOPROL-XL) 50 MG 24 hr tablet Take 50 mg by mouth daily. Take with or immediately following a meal.     omeprazole  (PRILOSEC) 40 MG capsule Take 1 capsule (40 mg total) by mouth daily. 30 capsule 1   ondansetron  (ZOFRAN -ODT) 4 MG disintegrating tablet Take 1 tablet (4 mg total) by mouth every 8 (eight) hours as needed for nausea or vomiting. 20 tablet 0   OneTouch Delica Lancets 30G MISC 2 (two) times daily.     ONETOUCH ULTRA test strip 2 (two) times daily as needed.     pravastatin (PRAVACHOL) 40 MG tablet Take 40 mg by mouth daily. occasionally     No current facility-administered medications for this visit.    Allergies as of 09/28/2023 - Review Complete 09/28/2023  Allergen Reaction Noted   Lipitor [atorvastatin] Other (See Comments) 09/30/2016    Family History  Problem Relation Age of Onset   Hypertension Mother    Cancer Father        prostate   Cancer Paternal Aunt        breast   Diabetes Paternal Uncle     Social History   Socioeconomic History   Marital status: Married    Spouse name: Not on file   Number of children: Not on file   Years of education: Not on file   Highest education level: Not on file  Occupational History   Not on file  Tobacco Use   Smoking status: Never   Smokeless tobacco: Never  Vaping Use   Vaping status: Never Used  Substance and Sexual Activity   Alcohol use: No    Drug use: No   Sexual activity: Not Currently    Birth control/protection: Surgical  Other Topics Concern   Not on file  Social History Narrative   Not on file   Social Drivers of Health   Financial Resource Strain: Not on file  Food Insecurity: Not on file  Transportation Needs: Not on file  Physical Activity: Not on file  Stress: Not on file  Social Connections: Not on file    Review of Systems: Gen: Denies fever, chills, cold or flulike symptoms, presyncope, syncope. CV: Denies chest pain, palpitations.  Resp: Denies dyspnea, cough. GI: See HPI Derm: Denies rash. Psych: Denies depression, anxiety. Heme: See HPI  Physical Exam: BP 139/88 (BP Location: Right Arm, Patient Position: Sitting, Cuff Size: Normal)   Pulse Aaron Aas)  56   Temp (!) 96.9 F (36.1 C) (Temporal)   Ht 5\' 2"  (1.575 m)   Wt 162 lb 9.6 oz (73.8 kg)   BMI 29.74 kg/m  General:   Alert and oriented. No distress noted. Pleasant and cooperative.  Head:  Normocephalic and atraumatic. Eyes:  Conjuctiva clear without scleral icterus. Heart:  S1, S2 present without murmurs appreciated. Lungs:  Clear to auscultation bilaterally. No wheezes, rales, or rhonchi. No distress.  Abdomen:  +BS, soft, and non-distended.  Minimal TTP in epigastric and RUQ region.  No rebound or guarding. No HSM or masses noted. Msk:  Symmetrical without gross deformities. Normal posture. Extremities:  Without edema. Neurologic:  Alert and  oriented x4 Psych:  Normal mood and affect.    Assessment:  73 year old female with history of diabetes, HTN, moderate aortic stenosis, GERD, presenting today for ER follow-up of upper abdominal pain, nausea, diarrhea, and gastric wall thickening.  She was evaluated in the emergency room on 4/4 for acute onset upper abdominal pain, nausea that started 1 day prior after taking doxycycline on empty stomach.  Laboratory evaluation unrevealing, but CT A/P with contrast showed marked severity asymmetric  hypodense gastric wall thickening which could be related to gastritis, but unable to exclude neoplasm.  She started on omeprazole  40 mg daily and has had significant improvement in her symptoms, essentially resolved.  Denies any routine NSAID use aside from 81 mg aspirin.  Previously taking esomeprazole on rare occasion for sensation of food or liquid getting stuck in her chest which has been a chronic intermittent issue for her.  Notably, last EGD January 2017 with findings concerning for Barrett's esophagus, but biopsies were negative.  She had nodularity in the fundal mucosa with biopsy showing mild chronic gastritis, no H. pylori.  Her esophagus was empirically dilated at that time and did show linear mucosal disruption at cervical esophagus indicative of disrupted web.   At this point, we need to proceed with EGD to evaluate gastric wall thickening.  Will also be able to evaluate her occasional dysphagia and dilate her esophagus if needed.    Plan:  Proceed with upper endoscopy +/- dilation with propofol  by Dr. Sammi Crick in the near future. The risks, benefits, and alternatives have been discussed with the patient in detail. The patient states understanding and desires to proceed.  ASA 3 No morning diabetes medications day of procedure *Please note, patient request that Dr. Sammi Crick not deliver any " bad news" to her family if he were to find something like cancer.  She would prefer to relay this information herself. Continue omeprazole  40 mg daily. Avoid NSAIDs. Follow-up per Dr. Elton Ham recommendations after EGD.    Shana Daring, PA-C [facility]enter[facility] 09/28/2023   I have reviewed the note and agree with the APP's assessment as described in this progress note  Samantha Cress, MD Gastroenterology and Hepatology [facility]

## 2023-09-26 NOTE — Progress Notes (Unsigned)
 Referring Provider: Elfredia Nevins, MD Primary Care Physician:  Elfredia Nevins, MD Primary GI Physician: Dr. Levon Hedger  No chief complaint on file.   HPI:   Lindsey Evans is a 73 y.o. female with medical history of diabetes, HTN, GERD, presenting today with chief complaint of nausea abdominal pain***  She was seen in the emergency room 09/23/2023.  She presented with new onset 7 out of 10 intermittent epigastric abdominal pain that started the morning prior.  She has been to the dentist and was prescribed antibiotics for dental abscess and stated symptoms started after taking antibiotics.  She had only taken 2 doses.  Also reporting diarrhea and nausea.  Laboratory evaluation remarkable for hemoglobin 15.3, sodium 134, potassium 3.4, glucose 174.  Troponin within normal limits.  Respiratory panel negative.  Lipase, LFTs normal.  CT A/P with contrast showing marked severity asymmetric hypodense gastric wall thickening which may be secondary to gastritis, the recommended endoscopy to exclude neoplasm.  She was given analgesics, antiemetics, IV fluids, and pantoprazole in the ER.  She was discharged on omeprazole 40 mg daily, Zofran as needed, and recommended follow-up with GI and PCP.   Today:      Last colonoscopy 10/14/2020: Diverticulosis in the sigmoid, descending, ascending colon.  Otherwise normal exam.  Recommended 10-year screening.  Last EGD 07/03/2015: Wavy GE junction with single patch of salmon colored mucosa greater than 10 mm in length. Biopsy taken from this area post dilation.  No evidence of esophageal stricture or erosions.  Fine nodularity noted to fundal mucosa. Biopsies taken.  Esophageal dilation with 54 French Maloney dilator resulting in linear mucosal disruption  at cervical esophagus indicative of disrupted web. Pathology showed mild chronic gastritis, no H. pylori, reflux changes in the esophagus, negative for Barrett's.  Past Medical History:  Diagnosis  Date   Diabetes mellitus without complication (HCC)    GERD (gastroesophageal reflux disease)    Hypertension    Neck pain    Vaginal dryness 12/14/2012    Past Surgical History:  Procedure Laterality Date   ABDOMINAL HYSTERECTOMY     CHOLECYSTECTOMY     Gallstones   COLONOSCOPY WITH PROPOFOL N/A 10/14/2020   Procedure: COLONOSCOPY WITH PROPOFOL;  Surgeon: Dolores Frame, MD;  [facility]: [facility];  Service: Gastroenterology;  Laterality: N/A;  12:30 PM   ESOPHAGOGASTRODUODENOSCOPY N/A 07/03/2015   Procedure: ESOPHAGOGASTRODUODENOSCOPY (EGD);  Surgeon: Malissa Hippo, MD;  Location: [facility];  Service: Endoscopy;  Laterality: N/A;  2:40 - moved to 1:45 - Ann to notify    Current Outpatient Medications  Medication Sig Dispense Refill   ALPRAZolam (XANAX) 0.5 MG tablet Take 0.5 mg by mouth every 6 (six) hours as needed for anxiety.     amLODipine (NORVASC) 5 MG tablet Take 5 mg by mouth daily.     aspirin 81 MG tablet Take 81 mg by mouth daily.     glimepiride (AMARYL) 4 MG tablet Take 4 mg by mouth daily.     hydrochlorothiazide (HYDRODIURIL) 25 MG tablet Take 25 mg by mouth daily.     Lidocaine 4 % PTCH Apply 1 patch topically 2 (two) times daily as needed (pain).     metoprolol succinate (TOPROL-XL) 50 MG 24 hr tablet Take 50 mg by mouth daily. Take with or immediately following a meal.     minocycline (MINOCIN) 100 MG capsule Take 100 mg by mouth 2 (two) times daily.     omeprazole (PRILOSEC) 40 MG capsule Take 1  capsule (40 mg total) by mouth daily. 30 capsule 1   ondansetron (ZOFRAN-ODT) 4 MG disintegrating tablet Take 1 tablet (4 mg total) by mouth every 8 (eight) hours as needed for nausea or vomiting. 20 tablet 0   OneTouch Delica Lancets 30G MISC 2 (two) times daily.     ONETOUCH ULTRA test strip 2 (two) times daily as needed.     pravastatin (PRAVACHOL) 40 MG tablet Take 40 mg by mouth daily.     Current Facility-Administered Medications  Medication Dose  Route Frequency Provider Last Rate Last Admin   methylPREDNISolone acetate (DEPO-MEDROL) injection 40 mg  40 mg Intra-articular Once Vickki Hearing, MD        Allergies as of 09/28/2023 - Review Complete 09/23/2023  Allergen Reaction Noted   Lipitor [atorvastatin] Other (See Comments) 09/30/2016    Family History  Problem Relation Age of Onset   Hypertension Mother    Cancer Father        prostate   Cancer Paternal Aunt        breast   Diabetes Paternal Uncle     Social History   Socioeconomic History   Marital status: Married    Spouse name: Not on file   Number of children: Not on file   Years of education: Not on file   Highest education level: Not on file  Occupational History   Not on file  Tobacco Use   Smoking status: Never   Smokeless tobacco: Never  Vaping Use   Vaping status: Never Used  Substance and Sexual Activity   Alcohol use: No   Drug use: No   Sexual activity: Not Currently    Birth control/protection: Surgical  Other Topics Concern   Not on file  Social History Narrative   Not on file   Social Drivers of Health   Financial Resource Strain: Not on file  Food Insecurity: Not on file  Transportation Needs: Not on file  Physical Activity: Not on file  Stress: Not on file  Social Connections: Not on file    Review of Systems: Gen: Denies fever, chills, anorexia. Denies fatigue, weakness, weight loss.  CV: Denies chest pain, palpitations, syncope, peripheral edema, and claudication. Resp: Denies dyspnea at rest, cough, wheezing, coughing up blood, and pleurisy. GI: Denies vomiting blood, jaundice, and fecal incontinence.   Denies dysphagia or odynophagia. Derm: Denies rash, itching, dry skin Psych: Denies depression, anxiety, memory loss, confusion. No homicidal or suicidal ideation.  Heme: Denies bruising, bleeding, and enlarged lymph nodes.  Physical Exam: There were no vitals taken for this visit. General:   Alert and oriented. No  distress noted. Pleasant and cooperative.  Head:  Normocephalic and atraumatic. Eyes:  Conjuctiva clear without scleral icterus. Heart:  S1, S2 present without murmurs appreciated. Lungs:  Clear to auscultation bilaterally. No wheezes, rales, or rhonchi. No distress.  Abdomen:  +BS, soft, non-tender and non-distended. No rebound or guarding. No HSM or masses noted. Msk:  Symmetrical without gross deformities. Normal posture. Extremities:  Without edema. Neurologic:  Alert and  oriented x4 Psych:  Normal mood and affect.    Assessment:     Plan:  ***   Ermalinda Memos, PA-C [facility] Gastroenter[facility] 09/28/2023

## 2023-09-28 ENCOUNTER — Ambulatory Visit (INDEPENDENT_AMBULATORY_CARE_PROVIDER_SITE_OTHER): Admitting: Gastroenterology

## 2023-09-28 ENCOUNTER — Encounter: Payer: Self-pay | Admitting: Gastroenterology

## 2023-09-28 VITALS — BP 139/88 | HR 56 | Temp 96.9°F | Ht 62.0 in | Wt 162.6 lb

## 2023-09-28 DIAGNOSIS — Z8719 Personal history of other diseases of the digestive system: Secondary | ICD-10-CM | POA: Diagnosis not present

## 2023-09-28 DIAGNOSIS — R933 Abnormal findings on diagnostic imaging of other parts of digestive tract: Secondary | ICD-10-CM | POA: Diagnosis not present

## 2023-09-28 DIAGNOSIS — R1310 Dysphagia, unspecified: Secondary | ICD-10-CM

## 2023-09-28 DIAGNOSIS — K3189 Other diseases of stomach and duodenum: Secondary | ICD-10-CM

## 2023-09-28 NOTE — Patient Instructions (Signed)
 Continue taking omeprazole 40 mg daily.   Avoid all NSAID products as much as possible. These include ibuprofen, Aleve, Advil, BC powders, Goody powders, and anything that says "NSAID" on the package.  We will get you scheduled for an upper endoscopy with possible stretching of your esophagus in the near future with Dr. Levon Hedger at [facility].  I will plan to see you back in the office after your procedure.  It was very nice to meet you today!  Ermalinda Memos, PA-C [facility] Gastroenterology

## 2023-09-29 ENCOUNTER — Ambulatory Visit: Admitting: Orthopedic Surgery

## 2023-09-29 DIAGNOSIS — M65331 Trigger finger, right middle finger: Secondary | ICD-10-CM | POA: Diagnosis not present

## 2023-09-29 MED ORDER — METHYLPREDNISOLONE ACETATE 40 MG/ML IJ SUSP
40.0000 mg | Freq: Once | INTRAMUSCULAR | Status: AC
  Administered 2023-09-29: 40 mg via INTRA_ARTICULAR

## 2023-09-29 NOTE — Addendum Note (Signed)
 Addended byCaffie Damme on: 09/29/2023 10:51 AM   Modules accepted: Orders

## 2023-09-29 NOTE — Progress Notes (Signed)
 Chief Complaint  Patient presents with   Hand Problem    Wants injection states using splint fingers tingle when she takes off the splint    Encounter Diagnosis  Name Primary?   Trigger finger, right middle finger Yes    73 year old female has had multiple injections over the years we discussed surgical options but she says every time she thinks about the surgery she changes her mind  She is having a little tingling in the finger she is having some catching not a lot of pain in the palm but she does have to force the finger back into extension  Trigger finger injection  Diagnosis   Encounter Diagnosis  Name Primary?   Trigger finger, right middle finger Yes    Procedure injection A1 pulley Medications lidocaine 1% 1 mL and Depo-Medrol 40 mg 1 mL Skin prep alcohol and ethyl chloride Verbal consent was obtained Timeout confirmed the injection site  After cleaning the skin with alcohol and anesthetizing the skin with ethyl chloride the A1 pulley was palpated and the injection was performed without complication

## 2023-09-30 ENCOUNTER — Ambulatory Visit: Attending: Cardiology | Admitting: Cardiology

## 2023-09-30 ENCOUNTER — Encounter: Payer: Self-pay | Admitting: Cardiology

## 2023-09-30 VITALS — BP 118/72 | HR 57 | Ht 62.0 in | Wt 159.6 lb

## 2023-09-30 DIAGNOSIS — I350 Nonrheumatic aortic (valve) stenosis: Secondary | ICD-10-CM | POA: Diagnosis not present

## 2023-09-30 DIAGNOSIS — R0789 Other chest pain: Secondary | ICD-10-CM | POA: Diagnosis not present

## 2023-09-30 DIAGNOSIS — I10 Essential (primary) hypertension: Secondary | ICD-10-CM | POA: Diagnosis not present

## 2023-09-30 DIAGNOSIS — I6529 Occlusion and stenosis of unspecified carotid artery: Secondary | ICD-10-CM | POA: Diagnosis not present

## 2023-09-30 DIAGNOSIS — E782 Mixed hyperlipidemia: Secondary | ICD-10-CM

## 2023-09-30 NOTE — Progress Notes (Signed)
 Clinical Summary Ms. Crupi is a 73 y.o.female seen today for follow up of the following medical problems.  1. Chest pain - history of prior noncardiac chest pains. By description has had MSK related pains, she also has long history of GERD.   - denies any recent chest pains. She does have chronic GERD that is controlled with PPI.    2. GERD - followed by GI     3. HTN - compliant with meds   4. DM2 - followed by pcp - dizziness on metformin now off   5. Hyperlipidemia - reports side effects on multiple statins - she is on pravastatin 20mg  daily - pcp increased to 40mg  just recently, she had not started  08/2022 TC 190TG 136 HDL 43 LDL 123 -upcoming labs in May - takes the pravastatin only about 3 days a week.   6. Valvular heart disease - mild aortic valve thickening by 2016 ehco 10/2021 LVEF 60-65%, mild MR, mod TR, mild to mod AS   7. Carotid stenosis 2019 mild bilateral disease.  10/2021 mild bilateral by [place]   Past Medical History:  Diagnosis Date   Aortic stenosis    Diabetes mellitus without complication (HCC)    GERD (gastroesophageal reflux disease)    Hypertension    Neck pain    Vaginal dryness 12/14/2012     Allergies  Allergen Reactions   Lipitor [Atorvastatin] Other (See Comments)    Leg cramps     Current Outpatient Medications  Medication Sig Dispense Refill   ALPRAZolam (XANAX) 0.5 MG tablet Take 0.5 mg by mouth every 6 (six) hours as needed for anxiety.     amLODipine (NORVASC) 5 MG tablet Take 5 mg by mouth daily.     aspirin 81 MG tablet Take 81 mg by mouth daily.     glimepiride (AMARYL) 4 MG tablet Take 4 mg by mouth daily.     hydrochlorothiazide (HYDRODIURIL) 25 MG tablet Take 25 mg by mouth daily.     Lidocaine 4 % PTCH Apply 1 patch topically 2 (two) times daily as needed (pain).     metoprolol succinate (TOPROL-XL) 50 MG 24 hr tablet Take 50 mg by mouth daily. Take with or immediately following a meal.     omeprazole  (PRILOSEC) 40 MG capsule Take 1 capsule (40 mg total) by mouth daily. 30 capsule 1   ondansetron (ZOFRAN-ODT) 4 MG disintegrating tablet Take 1 tablet (4 mg total) by mouth every 8 (eight) hours as needed for nausea or vomiting. 20 tablet 0   OneTouch Delica Lancets 30G MISC 2 (two) times daily.     ONETOUCH ULTRA test strip 2 (two) times daily as needed.     pravastatin (PRAVACHOL) 40 MG tablet Take 40 mg by mouth daily. occasionally     No current facility-administered medications for this visit.     Past Surgical History:  Procedure Laterality Date   ABDOMINAL HYSTERECTOMY     CHOLECYSTECTOMY     Gallstones   COLONOSCOPY WITH PROPOFOL N/A 10/14/2020   Procedure: COLONOSCOPY WITH PROPOFOL;  Surgeon: Dolores Frame, MD;  [facility];  Service: Gastroenterology;  Laterality: N/A;  12:30 PM   ESOPHAGOGASTRODUODENOSCOPY N/A 07/03/2015   Procedure: ESOPHAGOGASTRODUODENOSCOPY (EGD);  Surgeon: Malissa Hippo, MD;  [facility]: [facility];  Service: Endoscopy;  Laterality: N/A;  2:40 - moved to 1:45 - Ann to notify     Allergies  Allergen Reactions   Lipitor [Atorvastatin] Other (See Comments)  Leg cramps      Family History  Problem Relation Age of Onset   Hypertension Mother    Cancer Father        prostate   Cancer Paternal Aunt        breast   Diabetes Paternal Uncle      Social History Ms. Frisby reports that she has never smoked. She has never used smokeless tobacco. Ms. Wilcher reports no history of alcohol use.     Physical Examination Today's Vitals   09/30/23 1051  BP: 118/72  Pulse: (!) 57  SpO2: 97%  Weight: 159 lb 9.6 oz (72.4 kg)  Height: 5\' 2"  (1.575 m)  PainSc: 0-No pain   Body mass index is 29.19 kg/m.  Gen: resting comfortably, no acute distress HEENT: no scleral icterus, pupils equal round and reactive, no palptable cervical adenopathy,  CV: RRR, 3/6 systolic murmur rusb, no jvd. +bilateral carotid bruits Resp: Clear  to auscultation bilaterally GI: abdomen is soft, non-tender, non-distended, normal bowel sounds, no hepatosplenomegaly MSK: extremities are warm, no edema.  Skin: warm, no rash Neuro:  no focal deficits Psych: appropriate affect   Diagnostic Studies   05/2015 echo Study Conclusions   - Left ventricle: The cavity size was normal. Wall thickness was   increased in a pattern of mild LVH. Systolic function was normal.   The estimated ejection fraction was in the range of 60% to 65%.   Wall motion was normal; there were no regional wall motion   abnormalities. Doppler parameters are consistent with abnormal   left ventricular relaxation (grade 1 diastolic dysfunction). - Aortic valve: Mildly calcified annulus. Trileaflet; mildly   thickened leaflets. There was mild stenosis. Mean gradient (S): 8   mm Hg. Valve area (VTI): 1.82 cm^2. Valve area (Vmax): 1.64 cm^2. - Mitral valve: Mildly calcified annulus. Mildly thickened leaflets   . - Atrial septum: No defect or patent foramen ovale was identified. - Systemic veins: The IVC is small, suggesting low RA pressure and   hypovolemia. - Technically adequate study.  10/2021 echo 1. Left ventricular ejection fraction, by estimation, is 60 to 65%. The  left ventricle has normal function. The left ventricle has no regional  wall motion abnormalities. Left ventricular diastolic parameters are  consistent with Grade I diastolic  dysfunction (impaired relaxation). The average left ventricular global  longitudinal strain is -20.7 %. The global longitudinal strain is normal.   2. Right ventricular systolic function is normal. The right ventricular  size is normal. There is normal pulmonary artery systolic pressure. The  estimated right ventricular systolic pressure is 17.9 mmHg.   3. The mitral valve is grossly normal. Mild mitral valve regurgitation.   4. Tricuspid valve regurgitation is moderate.   5. The aortic valve is tricuspid. There is  moderate calcification of the  aortic valve. Aortic valve regurgitation is not visualized. Moderate  aortic valve stenosis with paradoxically low gradient. Aortic valve mean  gradient measures 14.8 mmHg.  Dimentionless index 0.38.   6. The inferior vena cava is normal in size with greater than 50%  respiratory variability, suggesting right atrial pressure of 3 mmHg.     Assessment and Plan  1. . Chest pain - history of noncardiac chest pain. - denies any recent symptoms, continue to monitor   2. HTN - she is at goal, continue current meds   3. Hyperlipidemia - side effects on statins in the past per report - tolerating pravastatin.   - poor compliance with statin,  encouraged to take daily - upcoming labs with pcp, suspect higher numbers given limited compliance.   4. Valvular heart disease - repeat echo to reassess, primary valve issue would be her mild to mod AS. Majority of criteria were moderate though mean grad just 15, SVI was 35 so perhaps reason for lower than expected gradient.    5. Carotid stenosis - repeat carotid US     Antoine Poche, M.D.

## 2023-09-30 NOTE — Patient Instructions (Signed)
 Medication Instructions:  Your physician recommends that you continue on your current medications as directed. Please refer to the Current Medication list given to you today.  *If you need a refill on your cardiac medications before your next appointment, please call your pharmacy*  Lab Work: None If you have labs (blood work) drawn today and your tests are completely normal, you will receive your results only by: MyChart Message (if you have MyChart) OR A paper copy in the mail If you have any lab test that is abnormal or we need to change your treatment, we will call you to review the results.  Testing/Procedures: Your physician has requested that you have an echocardiogram. Echocardiography is a painless test that uses sound waves to create images of your heart. It provides your doctor with information about the size and shape of your heart and how well your heart's chambers and valves are working. This procedure takes approximately one hour. There are no restrictions for this procedure. Please do NOT wear cologne, perfume, aftershave, or lotions (deodorant is allowed). Please arrive 15 minutes prior to your appointment time.  Please note: We ask at that you not bring children with you during ultrasound (echo/ vascular) testing. Due to room size and safety concerns, children are not allowed in the ultrasound rooms during exams. Our front office staff cannot provide observation of children in our lobby area while testing is being conducted. An adult accompanying a patient to their appointment will only be allowed in the ultrasound room at the discretion of the ultrasound technician under special circumstances. We apologize for any inconvenience.  Your physician has requested that you have a carotid duplex. This test is an ultrasound of the carotid arteries in your neck. It looks at blood flow through these arteries that supply the brain with blood. Allow one hour for this exam. There are no  restrictions or special instructions.   Follow-Up: At [facility], you and your health needs are our priority.  As part of our continuing mission to provide you with exceptional heart care, our providers are all part of one team.  This team includes your primary Cardiologist (physician) and Advanced Practice Providers or APPs (Physician Assistants and Nurse Practitioners) who all work together to provide you with the care you need, when you need it.  Your next appointment:   1 year(s)  Provider:   You may see Dina Rich, MD or one of the following Advanced Practice Providers on your designated Care Team:   Randall An, PA-C  Scotesia Xenia, [place] Jacolyn Reedy, [place]     We recommend signing up for the patient portal called "MyChart".  Sign up information is provided on this After Visit Summary.  MyChart is used to connect with patients for Virtual Visits (Telemedicine).  Patients are able to view lab/test results, encounter notes, upcoming appointments, etc.  Non-urgent messages can be sent to your provider as well.   To learn more about what you can do with MyChart, go to [email or web address].   Other Instructions

## 2023-10-04 ENCOUNTER — Encounter: Payer: Self-pay | Admitting: Internal Medicine

## 2023-10-10 ENCOUNTER — Telehealth: Payer: Self-pay | Admitting: *Deleted

## 2023-10-10 NOTE — Telephone Encounter (Signed)
 [facility]  EGD+/-ED w/Dr.Castaneda Asa 3 No am DM morning of procedure.

## 2023-10-11 ENCOUNTER — Encounter: Payer: Self-pay | Admitting: *Deleted

## 2023-10-11 NOTE — Telephone Encounter (Signed)
 Pt has been scheduled for 10/21/23. Pt will come by tomorrow morning to pick up instructions.

## 2023-10-12 ENCOUNTER — Encounter: Payer: Self-pay | Admitting: *Deleted

## 2023-10-14 ENCOUNTER — Ambulatory Visit (HOSPITAL_COMMUNITY)
Admission: RE | Admit: 2023-10-14 | Discharge: 2023-10-14 | Disposition: A | Discharge status: Home / Self Care | Source: Ambulatory Visit | Attending: Cardiology | Admitting: Cardiology

## 2023-10-14 DIAGNOSIS — I6529 Occlusion and stenosis of unspecified carotid artery: Secondary | ICD-10-CM

## 2023-10-14 DIAGNOSIS — I350 Nonrheumatic aortic (valve) stenosis: Secondary | ICD-10-CM | POA: Insufficient documentation

## 2023-10-14 LAB — ECHOCARDIOGRAM COMPLETE
AR max vel: 1.18 cm2
AV Area VTI: 1.29 cm2
AV Area mean vel: 1.17 cm2
AV Mean grad: 17.5 mmHg
AV Peak grad: 31.7 mmHg
AV Vena cont: 0.4 cm
Ao pk vel: 2.82 m/s
Area-P 1/2: 3.85 cm2
Calc EF: 75 %
MV VTI: 2.5 cm2
P 1/2 time: 933 ms
S' Lateral: 2.5 cm
Single Plane A2C EF: 77.5 %
Single Plane A4C EF: 70 %

## 2023-10-17 ENCOUNTER — Encounter (HOSPITAL_COMMUNITY)
Admission: RE | Admit: 2023-10-17 | Discharge: 2023-10-17 | Disposition: A | Discharge status: Home / Self Care | Source: Ambulatory Visit | Attending: Gastroenterology | Admitting: Gastroenterology

## 2023-10-17 ENCOUNTER — Encounter (HOSPITAL_COMMUNITY): Payer: Self-pay

## 2023-10-21 ENCOUNTER — Ambulatory Visit (HOSPITAL_COMMUNITY)
Admission: RE | Admit: 2023-10-21 | Discharge: 2023-10-21 | Disposition: A | Discharge status: Home / Self Care | Attending: Gastroenterology | Admitting: Gastroenterology

## 2023-10-21 ENCOUNTER — Ambulatory Visit (HOSPITAL_COMMUNITY): Admitting: Anesthesiology

## 2023-10-21 ENCOUNTER — Encounter (HOSPITAL_COMMUNITY)
Admission: RE | Disposition: A | Discharge status: Home / Self Care | Payer: Self-pay | Source: Home / Self Care | Attending: Gastroenterology

## 2023-10-21 ENCOUNTER — Encounter (HOSPITAL_COMMUNITY): Payer: Self-pay | Admitting: Gastroenterology

## 2023-10-21 ENCOUNTER — Ambulatory Visit (HOSPITAL_BASED_OUTPATIENT_CLINIC_OR_DEPARTMENT_OTHER): Admitting: Anesthesiology

## 2023-10-21 ENCOUNTER — Other Ambulatory Visit: Payer: Self-pay

## 2023-10-21 DIAGNOSIS — K2950 Unspecified chronic gastritis without bleeding: Secondary | ICD-10-CM | POA: Diagnosis not present

## 2023-10-21 DIAGNOSIS — K2289 Other specified disease of esophagus: Secondary | ICD-10-CM | POA: Diagnosis not present

## 2023-10-21 DIAGNOSIS — Z7984 Long term (current) use of oral hypoglycemic drugs: Secondary | ICD-10-CM | POA: Diagnosis not present

## 2023-10-21 DIAGNOSIS — E119 Type 2 diabetes mellitus without complications: Secondary | ICD-10-CM | POA: Diagnosis not present

## 2023-10-21 DIAGNOSIS — K219 Gastro-esophageal reflux disease without esophagitis: Secondary | ICD-10-CM | POA: Diagnosis not present

## 2023-10-21 DIAGNOSIS — Z79899 Other long term (current) drug therapy: Secondary | ICD-10-CM | POA: Diagnosis not present

## 2023-10-21 DIAGNOSIS — K3189 Other diseases of stomach and duodenum: Secondary | ICD-10-CM | POA: Diagnosis not present

## 2023-10-21 DIAGNOSIS — R1310 Dysphagia, unspecified: Secondary | ICD-10-CM | POA: Insufficient documentation

## 2023-10-21 DIAGNOSIS — K047 Periapical abscess without sinus: Secondary | ICD-10-CM | POA: Insufficient documentation

## 2023-10-21 DIAGNOSIS — R110 Nausea: Secondary | ICD-10-CM | POA: Diagnosis not present

## 2023-10-21 DIAGNOSIS — R1013 Epigastric pain: Secondary | ICD-10-CM | POA: Diagnosis present

## 2023-10-21 DIAGNOSIS — I10 Essential (primary) hypertension: Secondary | ICD-10-CM | POA: Diagnosis not present

## 2023-10-21 DIAGNOSIS — R197 Diarrhea, unspecified: Secondary | ICD-10-CM | POA: Diagnosis not present

## 2023-10-21 DIAGNOSIS — I350 Nonrheumatic aortic (valve) stenosis: Secondary | ICD-10-CM | POA: Insufficient documentation

## 2023-10-21 DIAGNOSIS — Z7982 Long term (current) use of aspirin: Secondary | ICD-10-CM | POA: Diagnosis not present

## 2023-10-21 HISTORY — DX: Cardiac murmur, unspecified: R01.1

## 2023-10-21 HISTORY — PX: ESOPHAGEAL DILATION: SHX303

## 2023-10-21 HISTORY — PX: ESOPHAGOGASTRODUODENOSCOPY: SHX5428

## 2023-10-21 LAB — GLUCOSE, CAPILLARY: Glucose-Capillary: 120 mg/dL — ABNORMAL HIGH (ref 70–99)

## 2023-10-21 SURGERY — EGD (ESOPHAGOGASTRODUODENOSCOPY)
Anesthesia: General

## 2023-10-21 MED ORDER — EPHEDRINE SULFATE-NACL 50-0.9 MG/10ML-% IV SOSY
PREFILLED_SYRINGE | INTRAVENOUS | Status: DC | PRN
  Administered 2023-10-21: 10 mg via INTRAVENOUS
  Administered 2023-10-21: 5 mg via INTRAVENOUS

## 2023-10-21 MED ORDER — PROPOFOL 10 MG/ML IV BOLUS
INTRAVENOUS | Status: DC | PRN
  Administered 2023-10-21: 100 mg via INTRAVENOUS

## 2023-10-21 MED ORDER — PHENYLEPHRINE 80 MCG/ML (10ML) SYRINGE FOR IV PUSH (FOR BLOOD PRESSURE SUPPORT)
PREFILLED_SYRINGE | INTRAVENOUS | Status: DC | PRN
  Administered 2023-10-21 (×2): 160 ug via INTRAVENOUS
  Administered 2023-10-21: 80 ug via INTRAVENOUS

## 2023-10-21 MED ORDER — LACTATED RINGERS IV SOLN
INTRAVENOUS | Status: DC | PRN
Start: 2023-10-21 — End: 2023-10-21

## 2023-10-21 MED ORDER — LIDOCAINE 2% (20 MG/ML) 5 ML SYRINGE
INTRAMUSCULAR | Status: DC | PRN
Start: 2023-10-21 — End: 2023-10-21
  Administered 2023-10-21: 100 mg via INTRAVENOUS

## 2023-10-21 MED ORDER — PROPOFOL 500 MG/50ML IV EMUL
INTRAVENOUS | Status: DC | PRN
  Administered 2023-10-21: 150 ug/kg/min via INTRAVENOUS

## 2023-10-21 NOTE — Transfer of Care (Signed)
 Immediate Anesthesia Transfer of Care Note  Patient: Lindsey Evans  Procedure(s) Performed: EGD (ESOPHAGOGASTRODUODENOSCOPY) DILATION, ESOPHAGUS  Patient Location: Short Stay  Anesthesia Type:General  Level of Consciousness: drowsy  Airway & Oxygen Therapy: Patient Spontanous Breathing  Post-op Assessment: Report given to RN and Post -op Vital signs reviewed and stable  Post vital signs: Reviewed and stable  Last Vitals:  Vitals Value Taken Time  BP 133/63   Temp    Pulse 60   Resp 20   SpO2 100%     Last Pain:  Vitals:   10/21/23 1422  TempSrc:   PainSc: 0-No pain      Patients Stated Pain Goal: 6 (10/21/23 1317)  Complications: No notable events documented.

## 2023-10-21 NOTE — Discharge Instructions (Signed)
 Resume your previous diet. We are waiting for your pathology results. Take Prilosec (omeprazole ) 40mg  by mouth once a day.

## 2023-10-21 NOTE — Interval H&P Note (Signed)
 History and Physical Interval Note:  10/21/2023 12:50 PM  Lindsey Evans  has presented today for surgery, with the diagnosis of gastric wall thickening,dysphagia,epigastric pain.  The various methods of treatment have been discussed with the patient and family. After consideration of risks, benefits and other options for treatment, the patient has consented to  Procedure(s) with comments: EGD (ESOPHAGOGASTRODUODENOSCOPY) (N/A) - 2:30 pm, asa 3 DILATION, ESOPHAGUS (N/A) as a surgical intervention.  The patient's history has been reviewed, patient examined, no change in status, stable for surgery.  I have reviewed the patient's chart and labs.  Questions were answered to the patient's satisfaction.     Aashir Umholtz Castaneda Mayorga

## 2023-10-21 NOTE — Op Note (Signed)
 [facility] Patient Name: Lindsey Evans Procedure Date: 10/21/2023 2:08 PM MRN: [id number] Date of Birth: 07/29/1950 Attending MD: Samantha Cress , , [id number] CSN: [id number] Age: 73 Admit Type: Outpatient Procedure:                Upper GI endoscopy Indications:              Epigastric abdominal pain Providers:                Samantha Cress, Crystal Page, [place] Wilson,                            Technician, Theola Fitch Referring MD:              Medicines:                Monitored Anesthesia Care Complications:            No immediate complications. Estimated Blood Loss:     Estimated blood loss: none. Procedure:                Pre-Anesthesia Assessment:                           - Prior to the procedure, a History and Physical                            was performed, and patient medications, allergies                            and sensitivities were reviewed. The patient's                            tolerance of previous anesthesia was reviewed.                           - The risks and benefits of the procedure and the                            sedation options and risks were discussed with the                            patient. All questions were answered and informed                            consent was obtained.                           - ASA Grade Assessment: II - A patient with mild                            systemic disease.                           After obtaining informed consent, the endoscope was                            passed under direct vision. Throughout the  procedure, the patient's blood pressure, pulse, and                            oxygen saturations were monitored continuously. The                            [id number]-[id number] ([id number]) scope was introduced through the                            mouth, and advanced to the second part of duodenum.                            The upper GI endoscopy was accomplished without                             difficulty. The patient tolerated the procedure                            well. Scope In: 2:28:58 PM Scope Out: 2:33:56 PM Total Procedure Duration: 0 hours 4 minutes 58 seconds  Findings:      The examined esophagus was normal.      The Z-line was irregular and was found 36 cm from the incisors. Biopsies       were taken with a cold forceps for histology.      A 6 mm healed ulcer was found on the posterior wall of the gastric body.       The scar tissue was healthy in appearance. Biopsies from body/antrum       were taken with a cold forceps for Helicobacter pylori testing.      The examined duodenum was normal. Impression:               - Normal esophagus.                           - Z-line irregular, 36 cm from the incisors.                            Biopsied.                           - Scar in the gastric body (posterior wall).                            Biopsied.                           - Normal examined duodenum. Moderate Sedation:      Per Anesthesia Care Recommendation:           - Discharge patient to home (ambulatory).                           - Resume previous diet.                           - Await pathology results.                           -  Use Prilosec (omeprazole ) 40 mg PO daily. Procedure Code(s):        --- Professional ---                           [phone], Esophagogastroduodenoscopy, flexible,                            transoral; with biopsy, single or multiple Diagnosis Code(s):        --- Professional ---                           K22.89, Other specified disease of esophagus                           K31.89, Other diseases of stomach and duodenum                           R10.13, Epigastric pain CPT copyright 2022 American Medical Association. All rights reserved. The codes documented in this report are preliminary and upon coder review may  be revised to meet current compliance requirements. Samantha Cress, MD Samantha Cress,  10/21/2023 2:37:41 PM This report has been signed electronically. Number of Addenda: 0

## 2023-10-21 NOTE — Anesthesia Procedure Notes (Signed)
 Date/Time: 10/21/2023 2:20 PM  Performed by: Sherwin Donate, CRNAPre-anesthesia Checklist: Patient identified, Emergency Drugs available, Suction available and Patient being monitored Patient Re-evaluated:Patient Re-evaluated prior to induction Oxygen Delivery Method: Nasal cannula Induction Type: IV induction Placement Confirmation: positive ETCO2 Comments: Optiflow High Flow [place] O2 used

## 2023-10-21 NOTE — Anesthesia Postprocedure Evaluation (Signed)
 Anesthesia Post Note  Patient: Lindsey Evans  Procedure(s) Performed: EGD (ESOPHAGOGASTRODUODENOSCOPY) DILATION, ESOPHAGUS  Patient location during evaluation: Phase II Anesthesia Type: General Level of consciousness: awake and alert Pain management: pain level controlled Vital Signs Assessment: post-procedure vital signs reviewed and stable Respiratory status: spontaneous breathing, nonlabored ventilation and respiratory function stable Cardiovascular status: blood pressure returned to baseline and stable Postop Assessment: no apparent nausea or vomiting Anesthetic complications: no   There were no known notable events for this encounter.   Last Vitals:  Vitals:   10/21/23 1325 10/21/23 1446  BP: (!) 160/79 133/63  Pulse: (!) 54 (!) 57  Resp: 10 (!) 21  Temp: 36.5 C (!) 36.2 C  SpO2: 100% 99%    Last Pain:  Vitals:   10/21/23 1446  TempSrc: Axillary  PainSc: 0-No pain                 Ehab Humber L Kasmira Cacioppo

## 2023-10-21 NOTE — Anesthesia Preprocedure Evaluation (Signed)
 Anesthesia Evaluation  Patient identified by MRN, date of birth, ID band Patient awake    Reviewed: Allergy & Precautions, NPO status , Patient's Chart, lab work & pertinent test results, reviewed documented beta blocker date and time   History of Anesthesia Complications (+) history of anesthetic complications  Airway Mallampati: II  TM Distance: >3 FB Neck ROM: Full    Dental  (+) Dental Advisory Given, Missing   Pulmonary neg pulmonary ROS   Pulmonary exam normal breath sounds clear to auscultation       Cardiovascular Exercise Tolerance: Good hypertension, Pt. on medications and Pt. on home beta blockers Normal cardiovascular exam Rhythm:Regular Rate:Normal     Neuro/Psych negative neurological ROS  negative psych ROS   GI/Hepatic Neg liver ROS,GERD  Medicated and Controlled,,  Endo/Other  diabetes, Well Controlled, Type 2, Oral Hypoglycemic Agents    Renal/GU      Musculoskeletal   Abdominal   Peds  Hematology   Anesthesia Other Findings   Reproductive/Obstetrics                             Anesthesia Physical Anesthesia Plan  ASA: 2  Anesthesia Plan: General   Post-op Pain Management: Minimal or no pain anticipated   Induction: Intravenous  PONV Risk Score and Plan: Propofol  infusion  Airway Management Planned: Nasal Cannula and Natural Airway  Additional Equipment: None  Intra-op Plan:   Post-operative Plan:   Informed Consent: I have reviewed the patients History and Physical, chart, labs and discussed the procedure including the risks, benefits and alternatives for the proposed anesthesia with the patient or authorized representative who has indicated his/her understanding and acceptance.     Dental advisory given  Plan Discussed with: CRNA  Anesthesia Plan Comments:         Anesthesia Quick Evaluation

## 2023-10-24 ENCOUNTER — Encounter (HOSPITAL_COMMUNITY): Payer: Self-pay | Admitting: Gastroenterology

## 2023-10-25 ENCOUNTER — Encounter (INDEPENDENT_AMBULATORY_CARE_PROVIDER_SITE_OTHER): Payer: Self-pay | Admitting: *Deleted

## 2023-10-25 LAB — SURGICAL PATHOLOGY

## 2023-11-10 ENCOUNTER — Ambulatory Visit: Payer: Medicare Other | Admitting: Cardiology

## 2023-11-24 ENCOUNTER — Ambulatory Visit: Payer: Self-pay | Admitting: Cardiology

## 2024-03-19 ENCOUNTER — Telehealth (INDEPENDENT_AMBULATORY_CARE_PROVIDER_SITE_OTHER): Payer: Self-pay | Admitting: Gastroenterology

## 2024-03-19 NOTE — Telephone Encounter (Signed)
 I spoke with the patient and made her aware per Charmaine Melia NP, Nothing urgent at this time and we can discuss her concern about weight loss and potential appetite decline and a better constipation regimen at her office visit. Patient states understanding.

## 2024-03-19 NOTE — Telephone Encounter (Signed)
 Patient called with complaint of constipation  When did the constipation begin?: Ongoing for a while  Is this a new problem or an ongoing problem?: Same as above, has appt with Charmaine Melia on 03/21/2024   Do you have abdominal pain associated with this?:   If so where?: No  Describe constipation/stools ( Hard? Soft? Incomplete bowel movement? Straining?):   Stool color: Brown in color has seen a small amount of blood in the stool and a small amount on tissue. She says the stools are not hard and she is not straining  How often are you having bowel movements? (How many days without a BM?): has around two to three bm per week, but does not feel she is able to empty herself out.   What has helped?: She is taking two laxative and it helps with in an hour.   What has been tried previously and did it help? Samples?: no  Has anything made it worse?: No  What other symptoms are associated with it? (Nausea?  Bloating?  Rectal pain?,  Rectal bleeding?): She does have bloating with she has no Bm, but no other issues. She says she looks like she is losing weight, but the scales are not really going down, she says she feels she is getting smaller. She would like something to help her gain weight.     Current pharmacy/Preferred pharmacy: [facility] Last office visit: Has an appointment with Charmaine on 03/21/2024. Please advise.

## 2024-03-19 NOTE — Telephone Encounter (Signed)
 Patient came to front desk asking to speak with nurse. I told her they were all with patents and I could send nurse a phone note. I also told patient she has OV with Courtney this Wednesday.I asked what problems or questions did she have for the nurse. She said it was personal and didn't want to tell me. She said to call her cell at [phone]

## 2024-03-20 NOTE — Progress Notes (Unsigned)
 GI Office Note    Referring Provider: Bertell Satterfield, MD Primary Care Physician:  Bertell Satterfield, MD Primary Gastroenterologist: Toribio Rubins, MD  Date:  03/20/2024  ID:  Lindsey, Evans March 17, 1951, MRN [id number]   Chief Complaint   No chief complaint on file.  History of Present Illness  Lindsey Evans is a 73 y.o. female with a history of HTN, GERD, esophageal web s/p dilation, and diabetes presenting today with complaint of ***  EGD January 2017: - Wavy GE junction with single patch of salmon-colored mucosa s/p biopsy and dilation.   - Cervical esophagus mucosal disruption indicative of disrupted web - No evidence of esophageal stricture erosions - Fine nodularity noted to fundal mucosa s/p biopsy - Pathology showed mild chronic gastritis negative for H. pylori and evidence of reflux changes in the esophagus.  Colonoscopy April 2022: - Sigmoid, descending, and ascending diverticulosis - Exam otherwise normal - Follow-up in 10 years  Last office visit 09/23/23 with Lindsey Centers, PA - C.  Patient reportedly prescribed doxycycline for a dental issue and states she took the medicine on empty stomach and that she began having abdominal pain, nausea, and diarrhea.  Her diarrhea resolved within 24 hours but abdominal pain and nausea continued even 48 hours later therefore she went to the ED.  Started omeprazole  given by the ED and stated she was feeling better without any symptoms.  Has a slight tinge of discomfort but if she ate something that goes away.  Prior to that she had been taking esomeprazole 40 mg as needed for globus sensation with eating solids.  Medication would help instantly with this and denied any regurgitation of food.  Indicates prior esophageal dilation in 2017 and has rare symptoms.  Reported her last episode was about 3 months prior and occurs with hotdogs, cabbage, and/or drinking water  too fast.  Notes to be on 81 mg aspirin and occasional  Advil  for gum swelling.  EGD May 2025: - Normal esophagus.  - Z- line irregular, 36 cm from the incisors. Biopsied.  - Scar in the gastric body ( posterior wall ) biopsied.  - normal duodenum.  -Pathology indicated nonspecific reactive gastropathy and patchy mild chronic gastritis.  Increased parietal cells indicative of PPI use.  Esophageal biopsies with squamous and cardia mucosa with no specific changes - Use omeprazole  40 mg once daily  Patient called in with complaints of constipation on 9/29 noting brown-colored stool and potential small amount of rectal bleeding on the stool and on the toilet tissue.  Has incomplete emptying and only about 2-3 bowel movements weekly.  Also having bloating without bowel movements.  Also with concerns that she is losing weight however she states her scales are not decreasing.  Today:  Discussed the use of AI scribe software for clinical note transcription with the patient, who gave verbal consent to proceed.    Wt Readings from Last 5 Encounters:  10/17/23 159 lb 9.6 oz (72.4 kg)  09/30/23 159 lb 9.6 oz (72.4 kg)  09/28/23 162 lb 9.6 oz (73.8 kg)  09/23/23 157 lb (71.2 kg)  09/24/22 160 lb (72.6 kg)    Current Outpatient Medications  Medication Sig Dispense Refill   ALPRAZolam (XANAX) 0.5 MG tablet Take 0.5 mg by mouth every 6 (six) hours as needed for anxiety.     amLODipine (NORVASC) 5 MG tablet Take 5 mg by mouth daily.     aspirin 81 MG tablet Take 81 mg by mouth daily.  glimepiride (AMARYL) 4 MG tablet Take 4 mg by mouth daily.     hydrochlorothiazide (HYDRODIURIL) 25 MG tablet Take 25 mg by mouth daily.     Lidocaine  4 % PTCH Apply 1 patch topically 2 (two) times daily as needed (pain). (Patient not taking: Reported on 09/30/2023)     metoprolol succinate (TOPROL-XL) 50 MG 24 hr tablet Take 50 mg by mouth daily. Take with or immediately following a meal.     omeprazole  (PRILOSEC) 40 MG capsule Take 1 capsule (40 mg total) by mouth  daily. 30 capsule 1   ondansetron  (ZOFRAN -ODT) 4 MG disintegrating tablet Take 1 tablet (4 mg total) by mouth every 8 (eight) hours as needed for nausea or vomiting. 20 tablet 0   OneTouch Delica Lancets 30G MISC 2 (two) times daily.     ONETOUCH ULTRA test strip 2 (two) times daily as needed.     pravastatin (PRAVACHOL) 40 MG tablet Take 40 mg by mouth daily. occasionally     No current facility-administered medications for this visit.    Past Medical History:  Diagnosis Date   Aortic stenosis    Diabetes mellitus without complication (HCC)    GERD (gastroesophageal reflux disease)    Heart murmur    Hypertension    Neck pain    Vaginal dryness 12/14/2012    Past Surgical History:  Procedure Laterality Date   ABDOMINAL HYSTERECTOMY     CHOLECYSTECTOMY     Gallstones   COLONOSCOPY WITH PROPOFOL  N/A 10/14/2020   Procedure: COLONOSCOPY WITH PROPOFOL ;  Surgeon: Eartha Angelia Sieving, MD;  [facility];  Service: Gastroenterology;  Laterality: N/A;  12:30 PM   ESOPHAGEAL DILATION N/A 10/21/2023   Procedure: DILATION, ESOPHAGUS;  Surgeon: Eartha Angelia Sieving, MD;  Location: [facility];  Service: Gastroenterology;  Laterality: N/A;   ESOPHAGOGASTRODUODENOSCOPY N/A 07/03/2015   Procedure: ESOPHAGOGASTRODUODENOSCOPY (EGD);  Surgeon: Claudis RAYMOND Rivet, MD;  Location: [facility];  Service: Endoscopy;  Laterality: N/A;  2:40 - moved to 1:45 - Ann to notify   ESOPHAGOGASTRODUODENOSCOPY N/A 10/21/2023   Procedure: EGD (ESOPHAGOGASTRODUODENOSCOPY);  Surgeon: Eartha Angelia, Sieving, MD;  Location: [facility];  Service: Gastroenterology;  Laterality: N/A;  2:30 pm, asa 3    Family History  Problem Relation Age of Onset   Hypertension Mother    Cancer Father        prostate   Cancer Paternal Aunt        breast   Diabetes Paternal Uncle     Allergies as of 03/21/2024 - Review Complete 10/21/2023  Allergen Reaction Noted   Lipitor [atorvastatin] Other (See  Comments) 09/30/2016    Social History   Socioeconomic History   Marital status: Married    Spouse name: Not on file   Number of children: Not on file   Years of education: Not on file   Highest education level: Not on file  Occupational History   Not on file  Tobacco Use   Smoking status: Never   Smokeless tobacco: Never  Vaping Use   Vaping status: Never Used  Substance and Sexual Activity   Alcohol use: No   Drug use: No   Sexual activity: Not Currently    Birth control/protection: Surgical  Other Topics Concern   Not on file  Social History Narrative   Not on file   Social Drivers of Health   Financial Resource Strain: Not on file  Food Insecurity: Not on file  Transportation Needs: Not on file  Physical Activity: Not on file  Stress: Not on file  Social Connections: Not on file     Review of Systems   Gen: Denies fever, chills, anorexia. Denies fatigue, weakness, weight loss.  CV: Denies chest pain, palpitations, syncope, peripheral edema, and claudication. Resp: Denies dyspnea at rest, cough, wheezing, coughing up blood, and pleurisy. GI: See HPI Derm: Denies rash, itching, dry skin Psych: Denies depression, anxiety, memory loss, confusion. No homicidal or suicidal ideation.  Heme: Denies bruising, bleeding, and enlarged lymph nodes.  Physical Exam   There were no vitals taken for this visit.  General:   Alert and oriented. No distress noted. Pleasant and cooperative.  Head:  Normocephalic and atraumatic. Eyes:  Conjuctiva clear without scleral icterus. Mouth:  Oral mucosa pink and moist. Good dentition. No lesions. Lungs:  Clear to auscultation bilaterally. No wheezes, rales, or rhonchi. No distress.  Heart:  S1, S2 present without murmurs appreciated.  Abdomen:  +BS, soft, non-tender and non-distended. No rebound or guarding. No HSM or masses noted. Rectal: *** Msk:  Symmetrical without gross deformities. Normal posture. Extremities:  Without  edema. Neurologic:  Alert and  oriented x4 Psych:  Alert and cooperative. Normal mood and affect.  Assessment  Lindsey Evans is a 73 y.o. female presenting today with constipation***    PLAN   ***   Follow up ***    Charmaine Melia, MSN, FNP-BC, AGACNP-BC [facility]

## 2024-03-21 ENCOUNTER — Ambulatory Visit: Admitting: Gastroenterology

## 2024-03-21 ENCOUNTER — Encounter: Payer: Self-pay | Admitting: Gastroenterology

## 2024-03-21 ENCOUNTER — Telehealth: Payer: Self-pay | Admitting: *Deleted

## 2024-03-21 VITALS — BP 118/73 | HR 66 | Temp 97.6°F | Ht 62.0 in | Wt 157.6 lb

## 2024-03-21 DIAGNOSIS — K219 Gastro-esophageal reflux disease without esophagitis: Secondary | ICD-10-CM

## 2024-03-21 DIAGNOSIS — K5909 Other constipation: Secondary | ICD-10-CM

## 2024-03-21 DIAGNOSIS — R634 Abnormal weight loss: Secondary | ICD-10-CM

## 2024-03-21 DIAGNOSIS — R6881 Early satiety: Secondary | ICD-10-CM

## 2024-03-21 DIAGNOSIS — R198 Other specified symptoms and signs involving the digestive system and abdomen: Secondary | ICD-10-CM | POA: Diagnosis not present

## 2024-03-21 DIAGNOSIS — K625 Hemorrhage of anus and rectum: Secondary | ICD-10-CM

## 2024-03-21 DIAGNOSIS — K648 Other hemorrhoids: Secondary | ICD-10-CM

## 2024-03-21 DIAGNOSIS — R140 Abdominal distension (gaseous): Secondary | ICD-10-CM

## 2024-03-21 MED ORDER — LINACLOTIDE 72 MCG PO CAPS: 72.0000 ug | ORAL_CAPSULE | Freq: Every day | ORAL | Status: DC

## 2024-03-21 NOTE — Telephone Encounter (Signed)
 Medication Samples have been provided to the patient.  Drug name: Linzess       Strength: 72mcg        Qty: 3  LOT: [id number]  Exp.Date: 08/2025  Dosing instructions: take one 30 mins before breakfast   The patient has been instructed regarding the correct time, dose, and frequency of taking this medication, including desired effects and most common side effects.   Adrien Pfeiffer Lorraine 9:15 AM 03/21/2024

## 2024-03-21 NOTE — Patient Instructions (Addendum)
 VISIT SUMMARY: During your visit, we discussed your chronic constipation and bloating, abnormal weight loss, GERD, and a recent episode of rectal bleeding. We reviewed your symptoms and current treatments, and we have made some adjustments to help manage your conditions more effectively.  YOUR PLAN: -CHRONIC CONSTIPATION WITH BLOATING: Chronic constipation with bloating means you have infrequent bowel movements and often feel like you haven't completely emptied your bowels.  We discussed trying Linzess, a medication that can help with your symptoms. You should take it daily on an empty stomach, but if it causes diarrhea, you can adjust to every other day. Please follow up to assess its effectiveness and make any necessary adjustments. High fiber diet - kiwi, pears, apples, prunes, figs, spinach, greens, pumpkin, squash, peas.   How to take Linzess: Once a day every day on empty stomach, at least 30 minutes before your first meal of the day. It is best to keep medications at a stable temperature Medication is best kept in its original bottle with the disket present.  It is a medication that is meant for everyday use and not to be used as needed.    What to expect: Constipation relief is typically felt in about 1 week Relief of abdominal pain, discomfort, and bloating begins in about 1 week with symptoms typically improving over 12 weeks and beyond. Diarrhea is most common side effect and typically begins within the first 2 weeks and can take 3-4 weeks to resolve It would be helpful to begin treatment over the weekend or when you can be closer to a bathroom   You can go to [email or web address] for patient support and sign up for daily medication reminders.    -ABNORMAL WEIGHT LOSS ASSOCIATED WITH EARLY SATIETY AND CHRONIC CONSTIPATION: Your weight loss and feeling full quickly are likely due to your bloating and constipation.  We recommend focusing on high-protein foods and trying protein  shakes like [employer], which is lactose-free.   Other options include premier protein, ensure, boost.  We will reassess your weight and dietary intake in 6-8 weeks to see how you're doing and consider appetite stimulants if needed.  -GERD WITH PRIOR EPIGASTRIC PAIN AND REACTIVE GASTROPATHY ON GASTRIC BIOPSIES: GERD is a condition where stomach acid frequently flows back into the tube connecting your mouth and stomach, causing pain and inflammation.  You should continue taking omeprazole  once daily to manage your symptoms and prevent further inflammation.  -SCANT RECTAL BLEEDING WITH SMALL INTERNAL HEMORRHOIDS: The small amount of blood you noticed is likely due to small internal hemorrhoids, which can be caused by constipation and straining. If the bleeding recurs, you can use over-the-counter Preparation H for relief.  You may take Tylenol  as needed for any pain or headaches.  Continue to avoid Aleve , Advil , ibuprofen , meloxicam, and Voltaren.  INSTRUCTIONS: Please follow up in 6-8 weeks to reassess your weight and dietary intake. Additionally, monitor the effectiveness of Linzess and report any side effects or concerns.  Contains text generated by [employer].   It was a pleasure to see you today. I want to create trusting relationships with patients. If you receive a survey regarding your visit,  I greatly appreciate you taking time to fill this out on paper or through your MyChart. I value your feedback.  Charmaine Melia, MSN, FNP-BC, AGACNP-BC [facility]

## 2024-03-26 ENCOUNTER — Telehealth: Payer: Self-pay | Admitting: *Deleted

## 2024-03-26 NOTE — Telephone Encounter (Signed)
 Pt called and states that he Linzess 72mcg that was given to her last week were not working. She states that she has not had a bowelment in several days. Please advise.

## 2024-03-27 ENCOUNTER — Other Ambulatory Visit: Payer: Self-pay

## 2024-03-27 MED ORDER — LINACLOTIDE 145 MCG PO CAPS: 145.0000 ug | ORAL_CAPSULE | Freq: Every day | ORAL | Status: DC

## 2024-03-27 MED ORDER — LINACLOTIDE 290 MCG PO CAPS: 290.0000 ug | ORAL_CAPSULE | Freq: Every day | ORAL | Status: DC

## 2024-03-27 NOTE — Telephone Encounter (Signed)
Spoke to pt, informed her of recommendations. Pt voiced understanding. Samples left at front desk.

## 2024-03-27 NOTE — Progress Notes (Signed)
 Medication Samples have been provided to the patient.  Drug name: Linzess       Strength: 145 mcg        Qty: 2  LOT: [id number]  Exp.Date: 09/18/2024  Dosing instructions: Take 1 capsule daily 30 mins before breakfast  The patient has been instructed regarding the correct time, dose, and frequency of taking this medication, including desired effects and most common side effects.   Lindsey Evans 9:10 AM 03/27/2024  Medication Samples have been provided to the patient.  Drug name: Linzess       Strength: 290 mcg        Qty: 2  LOT: [id number]  Exp.Date: 04/20/2025  Dosing instructions: Take one capsule daily 30 mins before breakfast  The patient has been instructed regarding the correct time, dose, and frequency of taking this medication, including desired effects and most common side effects.   Lindsey Evans [name] 9:11 AM 03/27/2024

## 2024-04-26 ENCOUNTER — Encounter (INDEPENDENT_AMBULATORY_CARE_PROVIDER_SITE_OTHER): Payer: Self-pay | Admitting: Gastroenterology

## 2024-05-09 ENCOUNTER — Encounter: Payer: Self-pay | Admitting: Gastroenterology

## 2024-05-09 ENCOUNTER — Ambulatory Visit: Admitting: Gastroenterology

## 2024-05-09 VITALS — BP 138/88 | HR 67 | Temp 97.6°F | Ht 62.0 in | Wt 159.2 lb

## 2024-05-09 DIAGNOSIS — K219 Gastro-esophageal reflux disease without esophagitis: Secondary | ICD-10-CM | POA: Diagnosis not present

## 2024-05-09 DIAGNOSIS — K5909 Other constipation: Secondary | ICD-10-CM | POA: Diagnosis not present

## 2024-05-09 DIAGNOSIS — R634 Abnormal weight loss: Secondary | ICD-10-CM | POA: Diagnosis not present

## 2024-05-09 NOTE — Patient Instructions (Addendum)
 Start taking fiber capsules daily - start with 2/day for several weeks and if you tolerate it you can increase to 3-4 per day.   Continue taking 1 senna nightly until you get established with the fiber. If you begin having more frequent looser stools we will I would recommend decreasing the senna to every other day or stopping altogether and just continuing with the fiber.  Continue to eat all your high-fiber foods.  I will give you a handout today about fiber content in foods that way this along with your supplementation you can look for striving to meet that 25 g/day.    Continue omeprazole  daily and avoiding trigger foods to keep your reflux under better control.   It was a pleasure to see you today. I want to create trusting relationships with patients. If you receive a survey regarding your visit,  I greatly appreciate you taking time to fill this out on paper or through your MyChart. I value your feedback.  Charmaine Melia, MSN, FNP-BC, AGACNP-BC [facility]

## 2024-05-09 NOTE — Progress Notes (Addendum)
 GI Office Note    Referring Provider: Bertell Satterfield, MD Primary Care Physician:  Bertell Satterfield, MD Primary Gastroenterologist: Toribio Rubins, MD  Date:  05/09/2024  ID:  Lindsey Evans, Lindsey Evans 03-Jun-1951, MRN [id number]   Chief Complaint   Chief Complaint  Patient presents with   Follow-up    Follow up. Stopped Linzess  made her stomach hurt and felt nauseated. Started taking over the counter bisacodyl usp 5mg  and sennosides usp 25mg . Now having regular bowel movements    History of Present Illness  Lindsey Evans is a 74 y.o. female with a history of HTN, GERD, esophageal web s/p dilation, and diabetes presenting today with complaint of ongoing constipation.   EGD January 2017: - Wavy GE junction with single patch of salmon-colored mucosa s/p biopsy and dilation.   - Cervical esophagus mucosal disruption indicative of disrupted web - No evidence of esophageal stricture erosions - Fine nodularity noted to fundal mucosa s/p biopsy - Pathology showed mild chronic gastritis negative for H. pylori and evidence of reflux changes in the esophagus.   Colonoscopy April 2022: - Sigmoid, descending, and ascending diverticulosis - Exam otherwise normal - Follow-up in 10 years   OV 09/23/23 with Josette Centers, PA - C.  Patient reportedly prescribed doxycycline for a dental issue and states she took the medicine on empty stomach and that she began having abdominal pain, nausea, and diarrhea.  Her diarrhea resolved within 24 hours but abdominal pain and nausea continued even 48 hours later therefore she went to the ED.  Started omeprazole  given by the ED and stated she was feeling better without any symptoms.  Has a slight tinge of discomfort but if she ate something that goes away.  Prior to that she had been taking esomeprazole 40 mg as needed for globus sensation with eating solids.  Medication would help instantly with this and denied any regurgitation of food.  Indicates  prior esophageal dilation in 2017 and has rare symptoms.  Reported her last episode was about 3 months prior and occurs with hotdogs, cabbage, and/or drinking water  too fast.  Notes to be on 81 mg aspirin and occasional Advil  for gum swelling.   EGD May 2025: - Normal esophagus.  - Z- line irregular, 36 cm from the incisors. Biopsied.  - Scar in the gastric body (posterior wall) biopsied.  - normal duodenum.  -Pathology indicated nonspecific reactive gastropathy and patchy mild chronic gastritis.  Increased parietal cells indicative of PPI use.  Esophageal biopsies with squamous and cardia mucosa with no specific changes - Use omeprazole  40 mg once daily   Patient called in with complaints of constipation on 9/29 noting brown-colored stool and potential small amount of rectal bleeding on the stool and on the toilet tissue.  Has incomplete emptying and only about 2-3 bowel movements weekly.  Also having bloating without bowel movements.  Also with concerns that she is losing weight however she states her scales are not decreasing.  Last office visit 03/21/2024.  She was experiencing bloating and incomplete bowel movements, often feeling the need to empty but only passing small amounts of stools and can go up to 3 days without a bowel movement.  Taking over-the-counter laxative which have been effective for her without causing pain.  Was taking 2 pills when needed but has not had to use them daily due to fear of dependency.  Unable to tolerate brand-name Dulcolax given stomach pain.  Stools are [place] 4 but small in nature and at  times increasing urgency without cramping.  Will have a normal bowel movement after the laxative but usually does not last long.  Never tried consistent use of fiber or MiraLAX in the past due to not liking powders, does better with pill form.  Having bloating and decreased appetite since her ER visit in April 2025.  She is concerned about her weight loss because she feels she  weighs too less for her frame.  Had a small amount of toilet tissue and a [name] with 1 bowel movement in the past.  Taking omeprazole  daily after her upper endoscopy and denied any nausea or vomiting.  Gave her samples of Linzess  72 mcg and encouraged a high-fiber diet.  Provided dietary recommendations and increasing protein intake.  Continue omeprazole  daily for reflux management and Preparation H for symptomatic relief of hemorrhoids if she had bleeding, pain, or swelling with hemorrhoids.   03/26/2024 - gave samples of Linzess  145 mcg and 290 mcg with directions for her to try 290 if no effect with the Linzess  145 mcg.   Today:  Discussed the use of AI scribe software for clinical note transcription with the patient, who gave verbal consent to proceed.  She experiences severe cramping and nausea after taking Linzess , which was prescribed to manage her constipation. The pain was intense, occurring throughout the day and night, and she felt like she was going to vomit constantly. Despite the medication, she was unable to have a bowel movement and experienced significant discomfort.  She has stopped taking Linzess . She is currently using Senna nightly 1-2 tablets and Dulcolax as needed. She takes Senna every day, usually at night, which she finds effective in maintaining regular bowel movements. Dulcolax, she took initially to be able to start having a BM. She is also consuming more fiber-rich foods such as broccoli, turnip greens, and string beans. She denies any abdominal pain and bowel movements are currently much more regular and effective now on her over the counter regimen.   Since discontinuing Linzess , she no longer experiences nausea or pain. She continues to manage her reflux with omeprazole  once daily. No breakthrough, nausea, or vomiting.   She mentions a recent weight gain of two pounds and wants to gain more weight, although she is concerned about gaining weight primarily in her stomach.  She is taking the protein shakes at least once daily. Additionally, she reports experiencing tingling in her fingers and is awaiting an appointment with a new healthcare provider in February.      Wt Readings from Last 10 Encounters:  05/09/24 159 lb 3.2 oz (72.2 kg)  03/21/24 157 lb 9.6 oz (71.5 kg)  10/17/23 159 lb 9.6 oz (72.4 kg)  09/30/23 159 lb 9.6 oz (72.4 kg)  09/28/23 162 lb 9.6 oz (73.8 kg)  09/23/23 157 lb (71.2 kg)  09/24/22 160 lb (72.6 kg)  11/05/21 166 lb 9.6 oz (75.6 kg)  10/14/20 158 lb (71.7 kg)  10/09/20 158 lb (71.7 kg)    Body mass index is 29.12 kg/m.   Current Outpatient Medications  Medication Sig Dispense Refill   ALPRAZolam (XANAX) 0.5 MG tablet Take 0.5 mg by mouth every 6 (six) hours as needed for anxiety.     amLODipine (NORVASC) 5 MG tablet Take 5 mg by mouth daily.     aspirin 81 MG tablet Take 81 mg by mouth daily.     bisacodyl (DULCOLAX) 5 MG EC tablet Take 5 mg by mouth daily as needed for moderate constipation.  glimepiride (AMARYL) 4 MG tablet Take 4 mg by mouth daily.     hydrochlorothiazide (HYDRODIURIL) 25 MG tablet Take 25 mg by mouth daily.     Lidocaine  4 % PTCH Apply 1 patch topically 2 (two) times daily as needed (pain).     metoprolol succinate (TOPROL-XL) 50 MG 24 hr tablet Take 50 mg by mouth daily. Take with or immediately following a meal.     omeprazole  (PRILOSEC) 40 MG capsule Take 1 capsule (40 mg total) by mouth daily. 30 capsule 1   ondansetron  (ZOFRAN -ODT) 4 MG disintegrating tablet Take 1 tablet (4 mg total) by mouth every 8 (eight) hours as needed for nausea or vomiting. 20 tablet 0   OneTouch Delica Lancets 30G MISC 2 (two) times daily.     ONETOUCH ULTRA test strip 2 (two) times daily as needed.     pravastatin (PRAVACHOL) 40 MG tablet Take 40 mg by mouth daily. occasionally     Sennosides 25 MG TABS Take by mouth.     Vitamin D, Ergocalciferol, (DRISDOL) 1.25 MG (50000 UNIT) CAPS capsule Take 50,000 Units by mouth  once a week.     linaclotide  (LINZESS ) 145 MCG CAPS capsule Take 1 capsule (145 mcg total) by mouth daily before breakfast. (Patient not taking: Reported on 05/09/2024)     linaclotide  (LINZESS ) 290 MCG CAPS capsule Take 1 capsule (290 mcg total) by mouth daily before breakfast. (Patient not taking: Reported on 05/09/2024)     linaclotide  (LINZESS ) 72 MCG capsule Take 1 capsule (72 mcg total) by mouth daily before breakfast. (Patient not taking: Reported on 05/09/2024)     No current facility-administered medications for this visit.    Past Medical History:  Diagnosis Date   Aortic stenosis    Diabetes mellitus without complication (HCC)    GERD (gastroesophageal reflux disease)    Heart murmur    Hypertension    Neck pain    Vaginal dryness 12/14/2012    Past Surgical History:  Procedure Laterality Date   ABDOMINAL HYSTERECTOMY     CHOLECYSTECTOMY     Gallstones   COLONOSCOPY WITH PROPOFOL  N/A 10/14/2020   Procedure: COLONOSCOPY WITH PROPOFOL ;  Surgeon: Eartha Angelia Sieving, MD;  [facility]: [facility];  Service: Gastroenterology;  Laterality: N/A;  12:30 PM   ESOPHAGEAL DILATION N/A 10/21/2023   Procedure: DILATION, ESOPHAGUS;  Surgeon: Eartha Angelia Sieving, MD;  Location: [facility];  Service: Gastroenterology;  Laterality: N/A;   ESOPHAGOGASTRODUODENOSCOPY N/A 07/03/2015   Procedure: ESOPHAGOGASTRODUODENOSCOPY (EGD);  Surgeon: Claudis RAYMOND Rivet, MD;  Location: [facility];  Service: Endoscopy;  Laterality: N/A;  2:40 - moved to 1:45 - Ann to notify   ESOPHAGOGASTRODUODENOSCOPY N/A 10/21/2023   Procedure: EGD (ESOPHAGOGASTRODUODENOSCOPY);  Surgeon: Eartha Angelia, Sieving, MD;  Location: [facility];  Service: Gastroenterology;  Laterality: N/A;  2:30 pm, asa 3    Family History  Problem Relation Age of Onset   Hypertension Mother    Cancer Father        prostate   Cancer Paternal Aunt        breast   Diabetes Paternal Uncle     Allergies as of 05/09/2024 -  Review Complete 03/21/2024  Allergen Reaction Noted   Lipitor [atorvastatin] Other (See Comments) 09/30/2016    Social History   Socioeconomic History   Marital status: Married    Spouse name: Not on file   Number of children: Not on file   Years of education: Not on file   Highest  education level: Not on file  Occupational History   Not on file  Tobacco Use   Smoking status: Never   Smokeless tobacco: Never  Vaping Use   Vaping status: Never Used  Substance and Sexual Activity   Alcohol use: No   Drug use: No   Sexual activity: Not Currently    Birth control/protection: Surgical  Other Topics Concern   Not on file  Social History Narrative   Not on file   Social Drivers of Health   Financial Resource Strain: Not on file  Food Insecurity: Not on file  Transportation Needs: Not on file  Physical Activity: Not on file  Stress: Not on file  Social Connections: Not on file    Review of Systems   Gen: Denies fever, chills, anorexia. Denies fatigue, weakness, weight loss.  CV: Denies chest pain, palpitations, syncope, peripheral edema, and claudication. Resp: Denies dyspnea at rest, cough, wheezing, coughing up blood, and pleurisy. GI: See HPI Derm: Denies rash, itching, dry skin Psych: Denies depression, anxiety, memory loss, confusion. No homicidal or suicidal ideation.  Heme: Denies bruising, bleeding, and enlarged lymph nodes.  Physical Exam   BP 138/88 (BP Location: Right Arm, Patient Position: Sitting, Cuff Size: Normal)   Pulse 67   Temp 97.6 F (36.4 C) (Temporal)   Ht 5' 2 (1.575 m)   Wt 159 lb 3.2 oz (72.2 kg)   BMI 29.12 kg/m   General:   Alert and oriented. No distress noted. Pleasant and cooperative.  Head:  Normocephalic and atraumatic. Eyes:  Conjuctiva clear without scleral icterus. Mouth:  Oral mucosa pink and moist. Good dentition. No lesions. Abdomen:  +BS, soft, non-distended. Mild ttp to epigastrium and mid left abdomen. No rebound or  guarding. No HSM or masses noted. Rectal: deferred Msk:  Symmetrical without gross deformities. Normal posture. Extremities:  Without edema. Neurologic:  Alert and  oriented x4 Psych:  Alert and cooperative. Normal mood and affect.  Assessment & Plan  SHAR PAEZ is a 74 y.o. female presenting today to discuss her constipation.   Chronic constipation History of chronic constipation. Trial of Linzess  72 ineffective and Linzess  145 mcg caused severe cramping and nausea without producing a bowel movement. Discontinued Linzess  due to adverse effects. Currently using Senna nightly and Dulcolax as needed with better results. Senna is preferred over Dulcolax due to potential long-term side effects of Dulcolax per the patient. Discussed potential for Senna to cause a color change in the colon with long-term use. Encouraged dietary fiber intake and introduce psyllium husk capsules as a natural fiber supplement to aid in regular bowel movements. - Continue Senna nightly as needed for bowel movements. - Use psyllium husk capsules, starting with two capsules once a day, and increase to three -five capsules as tolerated but to do this slowly.  - Encouraged dietary fiber intake through high-fiber fruits and vegetables - provided handout about fiber content in foods. Goal 25g/day.   Gastroesophageal reflux disease (GERD) with history of gastric ulcer GERD is well-controlled with omeprazole . No current issues reported with reflux symptoms, N/V. Prior endoscopy with scarring suggestive of healed gastric ulcer. Mild ttp to epigastrium and LUQ today but no guarding or rebound.  - Continue omeprazole  40 mg daily for GERD management.  Abnormal weight loss, improving Weight has increased by two pounds, indicating improvement in nutritional status. She is feeling better overall. Per review of weights, she has been 157-160 dating back to 2022. Protein shakes helping her feel better. Ct scan  in April 2025 without  concern for malignancy. As discussed previously, her smaller frame is likely secondary to muscle mass loss.  - Continue current dietary regimen, including protein shakes and high-fiber foods.      Follow up   Follow up 4 months.  Charmaine Melia, MSN, FNP-BC, AGACNP-BC [facility]  I have reviewed the note and agree with the APP's assessment as described in this progress note  Toribio Fortune, MD Gastroenterology and Hepatology [facility]

## 2024-05-15 ENCOUNTER — Encounter: Payer: Self-pay | Admitting: Emergency Medicine

## 2024-05-15 ENCOUNTER — Ambulatory Visit
Admission: EM | Admit: 2024-05-15 | Discharge: 2024-05-15 | Disposition: A | Discharge status: Home / Self Care | Attending: Nurse Practitioner | Admitting: Nurse Practitioner

## 2024-05-15 DIAGNOSIS — R202 Paresthesia of skin: Secondary | ICD-10-CM | POA: Diagnosis not present

## 2024-05-15 NOTE — ED Provider Notes (Signed)
 [facility]    CSN: [id number] Arrival date & time: 05/15/24  1253      History   Chief Complaint No chief complaint on file.   HPI Lindsey Evans is a 73 y.o. female.   Patient presents today with tingling in her right 2-4th fingertips for the past 3 months.  Symptoms have worsened over the past few days.  When she dangles her hand down, the tingling improves.  No recent fall, injury or trauma to the right hand.  She is right handed dominant and types on a computer frequently.  No change in color of the hand.  Reports history of trigger finger in the right middle finger and has seen Dr. Margrette in the past who performed a finger injection.      Past Medical History:  Diagnosis Date   Aortic stenosis    Diabetes mellitus without complication (HCC)    GERD (gastroesophageal reflux disease)    Heart murmur    Hypertension    Neck pain    Vaginal dryness 12/14/2012    Patient Active Problem List   Diagnosis Date Noted   Cervical disc disorder with radiculopathy 01/27/2021   Type 2 diabetes mellitus (HCC) 01/27/2021   Abdominal wall pain 10/09/2020   Family history of colon cancer 10/09/2020   Trigger finger, right middle finger 08/21/2018   GERD (gastroesophageal reflux disease) 06/12/2015   Dyspareunia 01/03/2014   Benign essential hypertension 01/03/2014   Hyperlipidemia 01/03/2014   Vaginal dryness 12/14/2012    Past Surgical History:  Procedure Laterality Date   ABDOMINAL HYSTERECTOMY     CHOLECYSTECTOMY     Gallstones   COLONOSCOPY WITH PROPOFOL  N/A 10/14/2020   Procedure: COLONOSCOPY WITH PROPOFOL ;  Surgeon: Eartha Angelia Sieving, MD;  [facility];  Service: Gastroenterology;  Laterality: N/A;  12:30 PM   ESOPHAGEAL DILATION N/A 10/21/2023   Procedure: DILATION, ESOPHAGUS;  Surgeon: Eartha Angelia Sieving, MD;  [facility];  Service: Gastroenterology;  Laterality: N/A;   ESOPHAGOGASTRODUODENOSCOPY N/A 07/03/2015    Procedure: ESOPHAGOGASTRODUODENOSCOPY (EGD);  Surgeon: Claudis RAYMOND Rivet, MD;  [facility];  Service: Endoscopy;  Laterality: N/A;  2:40 - moved to 1:45 - Ann to notify   ESOPHAGOGASTRODUODENOSCOPY N/A 10/21/2023   Procedure: EGD (ESOPHAGOGASTRODUODENOSCOPY);  Surgeon: Eartha Angelia, Sieving, MD;  [facility];  Service: Gastroenterology;  Laterality: N/A;  2:30 pm, asa 3    OB History     Gravida  2   Para  2   Term      Preterm      AB      Living         SAB      IAB      Ectopic      Multiple      Live Births               Home Medications    Prior to Admission medications   Medication Sig Start Date End Date Taking? Authorizing Provider  ALPRAZolam (XANAX) 0.5 MG tablet Take 0.5 mg by mouth every 6 (six) hours as needed for anxiety. 12/20/13   [provider]  amLODipine (NORVASC) 5 MG tablet Take 5 mg by mouth daily.    [provider]  aspirin 81 MG tablet Take 81 mg by mouth daily.    [provider]  bisacodyl (DULCOLAX) 5 MG EC tablet Take 5 mg by mouth daily as needed for moderate constipation.  [provider]  glimepiride (AMARYL) 4 MG tablet Take 4 mg by mouth daily. 10/29/21   [provider]  hydrochlorothiazide (HYDRODIURIL) 25 MG tablet Take 25 mg by mouth daily.    [provider]  Lidocaine  4 % PTCH Apply 1 patch topically 2 (two) times daily as needed (pain).    [provider]  metoprolol succinate (TOPROL-XL) 50 MG 24 hr tablet Take 50 mg by mouth daily. Take with or immediately following a meal.    [provider]  omeprazole  (PRILOSEC) 40 MG capsule Take 1 capsule (40 mg total) by mouth daily. 09/24/23 05/09/24  Franklyn Sid SAILOR, MD  ondansetron  (ZOFRAN -ODT) 4 MG disintegrating tablet Take 1 tablet (4 mg total) by mouth every 8 (eight) hours as needed for nausea or vomiting. 09/24/23   Franklyn Sid SAILOR, MD  OneTouch Delica Lancets 30G MISC 2 (two) times  daily. 11/25/20   [provider]  [facility] ULTRA test strip 2 (two) times daily as needed. 12/28/20   [provider]  pravastatin (PRAVACHOL) 40 MG tablet Take 40 mg by mouth daily. occasionally    [provider]  Sennosides 25 MG TABS Take by mouth.    [provider]  Vitamin D, Ergocalciferol, (DRISDOL) 1.25 MG (50000 UNIT) CAPS capsule Take 50,000 Units by mouth once a week. 02/17/24   [provider]    Family History Family History  Problem Relation Age of Onset   Hypertension Mother    Cancer Father        prostate   Cancer Paternal Aunt        breast   Diabetes Paternal Uncle     Social History Social History   Tobacco Use   Smoking status: Never   Smokeless tobacco: Never  Vaping Use   Vaping status: Never Used  Substance Use Topics   Alcohol use: No   Drug use: No     Allergies   Lipitor [atorvastatin]   Review of Systems Review of Systems Per HPI  Physical Exam Triage Vital Signs ED Triage Vitals  Encounter Vitals Group     BP 05/15/24 1349 124/75     Girls Systolic BP Percentile --      Girls Diastolic BP Percentile --      Boys Systolic BP Percentile --      Boys Diastolic BP Percentile --      Pulse Rate 05/15/24 1349 73     Resp 05/15/24 1349 18     Temp 05/15/24 1349 98.1 F (36.7 C)     Temp Source 05/15/24 1349 Oral     SpO2 05/15/24 1349 95 %     Weight --      Height --      Head Circumference --      Peak Flow --      Pain Score 05/15/24 1350 9     Pain Loc --      Pain Education --      Exclude from Growth Chart --    No data found.  Updated Vital Signs BP 124/75 (BP Location: Right Arm)   Pulse 73   Temp 98.1 F (36.7 C) (Oral)   Resp 18   SpO2 95%   Visual Acuity Right Eye Distance:   Left Eye Distance:   Bilateral Distance:    Right Eye Near:   Left Eye Near:    Bilateral Near:     Physical Exam Vitals and nursing note reviewed.  Constitutional:  General: She  is not in acute distress.    Appearance: Normal appearance. She is not toxic-appearing.  HENT:     Mouth/Throat:     Mouth: Mucous membranes are moist.     Pharynx: Oropharynx is clear.  Pulmonary:     Effort: Pulmonary effort is normal. No respiratory distress.  Musculoskeletal:     Comments: Positive Tinel test right wrist  Inspection: no swelling, bruising, obvious deformity or redness right wrist or hand Palpation right wrist, hand, fingers are nontender to palpation diffusely; no obvious deformities palpated ROM: Full ROM to right wrist and hand, all 5 digits of the right hand Strength: 5/5 bilateral upper extremities Neurovascular: neurovascularly intact in distal bilateral upper extremities  Skin:    General: Skin is warm and dry.     Capillary Refill: Capillary refill takes less than 2 seconds.     Coloration: Skin is not jaundiced or pale.     Findings: No erythema.  Neurological:     Mental Status: She is alert and oriented to person, place, and time.  Psychiatric:        Behavior: Behavior is cooperative.      UC Treatments / Results  Labs (all labs ordered are listed, but only abnormal results are displayed) Labs Reviewed - No data to display  EKG   Radiology No results found.  Procedures Procedures (including critical care time)  Medications Ordered in UC Medications - No data to display  Initial Impression / Assessment and Plan / UC Course  I have reviewed the triage vital signs and the nursing notes.  Pertinent labs & imaging results that were available during my care of the patient were reviewed by me and considered in my medical decision making (see chart for details).   Vital signs are stable and patient is well-appearing today.  I suspect carpal tunnel syndrome.  Patient was given a wrist brace and recommended close follow-up with Ortho-contact information was provided today.  Return and ER precautions discussed.  The patient was given the  opportunity to ask questions.  All questions answered to their satisfaction.  The patient is in agreement to this plan.   Final Clinical Impressions(s) / UC Diagnoses   Final diagnoses:  Tingling of right upper extremity     Discharge Instructions      Your symptoms are consistent with carpal tunnel syndrome.  Wear the wrist brace whenever you are typing or using your wrist and at night time.  Follow up with Dr. Areatha office if symptoms do not improve with this treatment.    ED Prescriptions   None    PDMP not reviewed this encounter.   Chandra Harlene LABOR, NP 05/15/24 [phone]

## 2024-05-15 NOTE — Discharge Instructions (Signed)
 Your symptoms are consistent with carpal tunnel syndrome.  Wear the wrist brace whenever you are typing or using your wrist and at night time.  Follow up with Dr. Areatha office if symptoms do not improve with this treatment.

## 2024-05-15 NOTE — ED Triage Notes (Signed)
 Right hand fingers are tingling consistently x 3 months.

## 2024-06-29 ENCOUNTER — Ambulatory Visit: Admitting: Orthopedic Surgery

## 2024-06-29 ENCOUNTER — Encounter: Payer: Self-pay | Admitting: Orthopedic Surgery

## 2024-06-29 VITALS — Ht 62.0 in | Wt 160.5 lb

## 2024-06-29 DIAGNOSIS — G5601 Carpal tunnel syndrome, right upper limb: Secondary | ICD-10-CM

## 2024-06-29 MED ORDER — METHYLPREDNISOLONE ACETATE 40 MG/ML IJ SUSP
40.0000 mg | Freq: Once | INTRAMUSCULAR | Status: AC
  Administered 2024-06-29: 40 mg via INTRA_ARTICULAR

## 2024-06-29 MED ORDER — GABAPENTIN 100 MG PO CAPS: 100.0000 mg | ORAL_CAPSULE | Freq: Three times a day (TID) | ORAL | 2 refills | Status: AC

## 2024-06-29 NOTE — Progress Notes (Signed)
 "  Office Visit Note   Patient: Lindsey Evans           Date of Birth: 08-17-1950           MRN: [id number] Visit Date: 06/29/2024 Requested by: Bertell Satterfield, MD [address] [place],  [place] [address] PCP: Edman Meade PEDLAR, FNP   Assessment & Plan:   Encounter Diagnosis  Name Primary?   Carpal tunnel syndrome of right wrist Yes    Meds ordered this encounter  Medications   gabapentin  (NEURONTIN ) 100 MG capsule    Sig: Take 1 capsule (100 mg total) by mouth 3 (three) times daily.    Dispense:  90 capsule    Refill:  2   methylPREDNISolone  acetate (DEPO-MEDROL ) injection 40 mg    This is a 74 year old female with carpal tunnel syndrome who wishes to avoid surgical intervention and asked about other modes of treatment.  We discussed   medical management  bracing  Injection  She decided to have a carpal tunnel injection  Procedure note - 20526  Carpal tunnel injection right wrist  Site was confirmed timeout was completed for right wrist carpal tunnel injection  We prepped the skin with alcohol and then ethyl chloride was used to anesthetize the skin.  We injected to the ulnar side of the ring finger and get good medication in the carpal tunnel.  Patient advised that the hand would go numb for several hours  She will  return to see us  for follow-up in 4 to 6 weeks  and we will start medication and  wear wrist splint at night   Subjective: Chief Complaint  Patient presents with   Hand Pain    R Hand/ tingling on the end and very stiff. Went to [facility] and referred me here. It is hard for me to pick up things and the left hand is beginning to hurt some.    HPI: 74 year old female with 4 to 6 months of tingling numbness in pain in her right upper extremity involving all 5 digits.  She did try to wear a brace for several weeks with no improvement.  She has a history of a right long finger trigger which she can still not flex all the way down               ROS: Nocturnal symptoms are present   Images personally read and my interpretation : No new imaging  Visit Diagnoses:  1. Carpal tunnel syndrome of right wrist      Follow-Up Instructions: Return in about 4 weeks (around 07/27/2024) for FOLLOW UP, RIGHT CTS .    Objective: Vital Signs: Ht 5' 2 (1.575 m)   Wt 160 lb 8 oz (72.8 kg)   BMI 29.36 kg/m   Physical Exam Vitals and nursing note reviewed.  Constitutional:      Appearance: Normal appearance.  HENT:     Head: Normocephalic and atraumatic.  Eyes:     General: No scleral icterus.       Right eye: No discharge.        Left eye: No discharge.     Extraocular Movements: Extraocular movements intact.     Conjunctiva/sclera: Conjunctivae normal.     Pupils: Pupils are equal, round, and reactive to light.  Cardiovascular:     Rate and Rhythm: Normal rate.     Pulses: Normal pulses.  Skin:    General: Skin is warm and dry.     Capillary Refill: Capillary  refill takes less than 2 seconds.  Neurological:     General: No focal deficit present.     Mental Status: She is alert and oriented to person, place, and time.  Psychiatric:        Mood and Affect: Mood normal.        Behavior: Behavior normal.        Thought Content: Thought content normal.        Judgment: Judgment normal.      Right Hand Exam   Comments:  Right hand skin is normal and pulses and perfusion are normal  She has decreased sensation in all 5 digits but normal 2-point discrimination with the paperclip test  Grip strength is normal she has decreased range of motion in the right long finger cannot get the fingers all the way down into the palm  Compression testing over the carpal tunnel causes increased numbness and tingling       Specialty Comments:  No specialty comments available.  Imaging: No results found.   PMFS History: Patient Active Problem List   Diagnosis Date Noted   Cervical disc disorder with radiculopathy 01/27/2021    Type 2 diabetes mellitus (HCC) 01/27/2021   Abdominal wall pain 10/09/2020   Family history of colon cancer 10/09/2020   Trigger finger, right middle finger 08/21/2018   GERD (gastroesophageal reflux disease) 06/12/2015   Dyspareunia 01/03/2014   Benign essential hypertension 01/03/2014   Hyperlipidemia 01/03/2014   Vaginal dryness 12/14/2012   Past Medical History:  Diagnosis Date   Aortic stenosis    Diabetes mellitus without complication (HCC)    GERD (gastroesophageal reflux disease)    Heart murmur    Hypertension    Neck pain    Vaginal dryness 12/14/2012    Family History  Problem Relation Age of Onset   Hypertension Mother    Cancer Father        prostate   Cancer Paternal Aunt        breast   Diabetes Paternal Uncle     Past Surgical History:  Procedure Laterality Date   ABDOMINAL HYSTERECTOMY     CHOLECYSTECTOMY     Gallstones   COLONOSCOPY WITH PROPOFOL  N/A 10/14/2020   Procedure: COLONOSCOPY WITH PROPOFOL ;  Surgeon: Eartha Angelia Sieving, MD;  Location: [facility];  Service: Gastroenterology;  Laterality: N/A;  12:30 PM   ESOPHAGEAL DILATION N/A 10/21/2023   Procedure: DILATION, ESOPHAGUS;  Surgeon: Eartha Angelia Sieving, MD;  Location: [facility];  Service: Gastroenterology;  Laterality: N/A;   ESOPHAGOGASTRODUODENOSCOPY N/A 07/03/2015   Procedure: ESOPHAGOGASTRODUODENOSCOPY (EGD);  Surgeon: Claudis RAYMOND Rivet, MD;  Location: [facility];  Service: Endoscopy;  Laterality: N/A;  2:40 - moved to 1:45 - Ann to notify   ESOPHAGOGASTRODUODENOSCOPY N/A 10/21/2023   Procedure: EGD (ESOPHAGOGASTRODUODENOSCOPY);  Surgeon: Eartha Angelia, Sieving, MD;  Location: [facility];  Service: Gastroenterology;  Laterality: N/A;  2:30 pm, asa 3   Social History   Occupational History   Not on file  Tobacco Use   Smoking status: Never   Smokeless tobacco: Never  Vaping Use   Vaping status: Never Used  Substance and Sexual Activity   Alcohol use: No   Drug  use: No   Sexual activity: Not Currently    Birth control/protection: Surgical      "

## 2024-06-29 NOTE — Patient Instructions (Signed)
 BRACE AT NIGHT   TAKE MEDICATION AS DIRECTED

## 2024-07-11 ENCOUNTER — Ambulatory Visit: Admitting: Family Medicine

## 2024-08-03 ENCOUNTER — Ambulatory Visit: Admitting: Family Medicine

## 2024-08-06 ENCOUNTER — Ambulatory Visit: Admitting: Orthopedic Surgery

## 2024-09-05 ENCOUNTER — Ambulatory Visit: Admitting: Gastroenterology

## 2024-09-14 ENCOUNTER — Ambulatory Visit (HOSPITAL_BASED_OUTPATIENT_CLINIC_OR_DEPARTMENT_OTHER): Admitting: Family Medicine
